# Patient Record
Sex: Male | Born: 1940
Health system: Southern US, Community
[De-identification: ages and names within clinical notes are randomized; demographics above are authoritative.]

## PROBLEM LIST (undated history)

## (undated) DIAGNOSIS — K4090 Unilateral inguinal hernia, without obstruction or gangrene, not specified as recurrent: Secondary | ICD-10-CM

## (undated) DIAGNOSIS — M5137 Other intervertebral disc degeneration, lumbosacral region: Secondary | ICD-10-CM

## (undated) DIAGNOSIS — M5030 Other cervical disc degeneration, unspecified cervical region: Secondary | ICD-10-CM

## (undated) DIAGNOSIS — M4300 Spondylolysis, site unspecified: Secondary | ICD-10-CM

## (undated) DIAGNOSIS — Z87442 Personal history of urinary calculi: Secondary | ICD-10-CM

## (undated) DIAGNOSIS — M5431 Sciatica, right side: Secondary | ICD-10-CM

## (undated) DIAGNOSIS — E559 Vitamin D deficiency, unspecified: Secondary | ICD-10-CM

## (undated) DIAGNOSIS — M1990 Unspecified osteoarthritis, unspecified site: Secondary | ICD-10-CM

## (undated) DIAGNOSIS — E785 Hyperlipidemia, unspecified: Secondary | ICD-10-CM

## (undated) HISTORY — PX: TONSILLECTOMY: SUR1361

## (undated) HISTORY — DX: Hyperlipidemia, unspecified: E78.5

## (undated) HISTORY — PX: APPENDECTOMY: SHX54

## (undated) HISTORY — PX: OTHER SURGICAL HISTORY: SHX169

---

## 2003-08-09 HISTORY — PX: REPLACEMENT TOTAL HIP W/  RESURFACING IMPLANTS: SUR1222

## 2003-08-09 HISTORY — PX: TOTAL HIP ARTHROPLASTY: SHX124

## 2005-09-20 ENCOUNTER — Ambulatory Visit (HOSPITAL_BASED_OUTPATIENT_CLINIC_OR_DEPARTMENT_OTHER): Admission: RE | Admit: 2005-09-20 | Discharge: 2005-09-20 | Payer: Self-pay | Admitting: Orthopaedic Surgery

## 2011-02-25 ENCOUNTER — Other Ambulatory Visit: Payer: Self-pay | Admitting: Orthopedic Surgery

## 2011-02-25 ENCOUNTER — Ambulatory Visit
Admission: RE | Admit: 2011-02-25 | Discharge: 2011-02-25 | Disposition: A | Discharge status: Home / Self Care | Payer: Medicare Other | Source: Ambulatory Visit | Attending: Orthopedic Surgery | Admitting: Orthopedic Surgery

## 2011-02-25 DIAGNOSIS — M169 Osteoarthritis of hip, unspecified: Secondary | ICD-10-CM

## 2011-02-25 DIAGNOSIS — M25559 Pain in unspecified hip: Secondary | ICD-10-CM

## 2011-05-08 ENCOUNTER — Emergency Department (HOSPITAL_COMMUNITY)
Admission: EM | Admit: 2011-05-08 | Discharge: 2011-05-08 | Disposition: A | Discharge status: Home / Self Care | Payer: Medicare Other | Attending: Emergency Medicine | Admitting: Emergency Medicine

## 2011-05-08 ENCOUNTER — Emergency Department (HOSPITAL_COMMUNITY): Payer: Medicare Other

## 2011-05-08 DIAGNOSIS — S20229A Contusion of unspecified back wall of thorax, initial encounter: Secondary | ICD-10-CM | POA: Insufficient documentation

## 2011-05-08 DIAGNOSIS — IMO0002 Reserved for concepts with insufficient information to code with codable children: Secondary | ICD-10-CM | POA: Insufficient documentation

## 2011-05-08 DIAGNOSIS — T1490XA Injury, unspecified, initial encounter: Secondary | ICD-10-CM | POA: Insufficient documentation

## 2011-05-08 DIAGNOSIS — M542 Cervicalgia: Secondary | ICD-10-CM | POA: Insufficient documentation

## 2011-05-08 DIAGNOSIS — S139XXA Sprain of joints and ligaments of unspecified parts of neck, initial encounter: Secondary | ICD-10-CM | POA: Insufficient documentation

## 2011-05-08 DIAGNOSIS — S5000XA Contusion of unspecified elbow, initial encounter: Secondary | ICD-10-CM | POA: Insufficient documentation

## 2011-09-07 DIAGNOSIS — Z0000 Encounter for general adult medical examination without abnormal findings: Secondary | ICD-10-CM | POA: Diagnosis not present

## 2011-10-24 DIAGNOSIS — E785 Hyperlipidemia, unspecified: Secondary | ICD-10-CM | POA: Diagnosis not present

## 2011-10-24 DIAGNOSIS — E559 Vitamin D deficiency, unspecified: Secondary | ICD-10-CM | POA: Diagnosis not present

## 2011-10-24 DIAGNOSIS — Z125 Encounter for screening for malignant neoplasm of prostate: Secondary | ICD-10-CM | POA: Diagnosis not present

## 2011-10-24 DIAGNOSIS — Z0000 Encounter for general adult medical examination without abnormal findings: Secondary | ICD-10-CM | POA: Diagnosis not present

## 2011-10-24 DIAGNOSIS — R7301 Impaired fasting glucose: Secondary | ICD-10-CM | POA: Diagnosis not present

## 2011-10-31 DIAGNOSIS — Z0000 Encounter for general adult medical examination without abnormal findings: Secondary | ICD-10-CM | POA: Diagnosis not present

## 2011-10-31 DIAGNOSIS — R7301 Impaired fasting glucose: Secondary | ICD-10-CM | POA: Diagnosis not present

## 2011-10-31 DIAGNOSIS — E785 Hyperlipidemia, unspecified: Secondary | ICD-10-CM | POA: Diagnosis not present

## 2011-10-31 DIAGNOSIS — Z125 Encounter for screening for malignant neoplasm of prostate: Secondary | ICD-10-CM | POA: Diagnosis not present

## 2011-11-01 DIAGNOSIS — Z1212 Encounter for screening for malignant neoplasm of rectum: Secondary | ICD-10-CM | POA: Diagnosis not present

## 2012-01-06 DIAGNOSIS — M25569 Pain in unspecified knee: Secondary | ICD-10-CM | POA: Diagnosis not present

## 2012-01-12 DIAGNOSIS — H25099 Other age-related incipient cataract, unspecified eye: Secondary | ICD-10-CM | POA: Diagnosis not present

## 2012-02-15 DIAGNOSIS — L821 Other seborrheic keratosis: Secondary | ICD-10-CM | POA: Diagnosis not present

## 2012-02-15 DIAGNOSIS — L919 Hypertrophic disorder of the skin, unspecified: Secondary | ICD-10-CM | POA: Diagnosis not present

## 2012-02-15 DIAGNOSIS — D239 Other benign neoplasm of skin, unspecified: Secondary | ICD-10-CM | POA: Diagnosis not present

## 2012-02-15 DIAGNOSIS — L909 Atrophic disorder of skin, unspecified: Secondary | ICD-10-CM | POA: Diagnosis not present

## 2012-08-23 ENCOUNTER — Other Ambulatory Visit: Payer: Self-pay | Admitting: Dermatology

## 2012-08-23 DIAGNOSIS — L851 Acquired keratosis [keratoderma] palmaris et plantaris: Secondary | ICD-10-CM | POA: Diagnosis not present

## 2012-08-23 DIAGNOSIS — D485 Neoplasm of uncertain behavior of skin: Secondary | ICD-10-CM | POA: Diagnosis not present

## 2012-10-23 DIAGNOSIS — S4350XA Sprain of unspecified acromioclavicular joint, initial encounter: Secondary | ICD-10-CM | POA: Diagnosis not present

## 2012-10-31 DIAGNOSIS — H0019 Chalazion unspecified eye, unspecified eyelid: Secondary | ICD-10-CM | POA: Diagnosis not present

## 2012-11-12 DIAGNOSIS — M25519 Pain in unspecified shoulder: Secondary | ICD-10-CM | POA: Diagnosis not present

## 2012-11-12 DIAGNOSIS — S4350XA Sprain of unspecified acromioclavicular joint, initial encounter: Secondary | ICD-10-CM | POA: Diagnosis not present

## 2012-11-30 DIAGNOSIS — M25519 Pain in unspecified shoulder: Secondary | ICD-10-CM | POA: Diagnosis not present

## 2012-11-30 DIAGNOSIS — S4350XA Sprain of unspecified acromioclavicular joint, initial encounter: Secondary | ICD-10-CM | POA: Diagnosis not present

## 2012-12-10 DIAGNOSIS — M25519 Pain in unspecified shoulder: Secondary | ICD-10-CM | POA: Diagnosis not present

## 2012-12-14 DIAGNOSIS — Z23 Encounter for immunization: Secondary | ICD-10-CM | POA: Diagnosis not present

## 2012-12-19 DIAGNOSIS — E785 Hyperlipidemia, unspecified: Secondary | ICD-10-CM | POA: Diagnosis not present

## 2012-12-19 DIAGNOSIS — Z125 Encounter for screening for malignant neoplasm of prostate: Secondary | ICD-10-CM | POA: Diagnosis not present

## 2012-12-19 DIAGNOSIS — R7301 Impaired fasting glucose: Secondary | ICD-10-CM | POA: Diagnosis not present

## 2012-12-19 DIAGNOSIS — E559 Vitamin D deficiency, unspecified: Secondary | ICD-10-CM | POA: Diagnosis not present

## 2012-12-25 DIAGNOSIS — Z1212 Encounter for screening for malignant neoplasm of rectum: Secondary | ICD-10-CM | POA: Diagnosis not present

## 2013-01-02 DIAGNOSIS — Z23 Encounter for immunization: Secondary | ICD-10-CM | POA: Diagnosis not present

## 2013-01-02 DIAGNOSIS — R7301 Impaired fasting glucose: Secondary | ICD-10-CM | POA: Diagnosis not present

## 2013-01-02 DIAGNOSIS — E559 Vitamin D deficiency, unspecified: Secondary | ICD-10-CM | POA: Diagnosis not present

## 2013-01-02 DIAGNOSIS — Z1331 Encounter for screening for depression: Secondary | ICD-10-CM | POA: Diagnosis not present

## 2013-01-02 DIAGNOSIS — Z6827 Body mass index (BMI) 27.0-27.9, adult: Secondary | ICD-10-CM | POA: Diagnosis not present

## 2013-01-02 DIAGNOSIS — Z125 Encounter for screening for malignant neoplasm of prostate: Secondary | ICD-10-CM | POA: Diagnosis not present

## 2013-01-02 DIAGNOSIS — M169 Osteoarthritis of hip, unspecified: Secondary | ICD-10-CM | POA: Diagnosis not present

## 2013-01-02 DIAGNOSIS — Z0000 Encounter for general adult medical examination without abnormal findings: Secondary | ICD-10-CM | POA: Diagnosis not present

## 2013-01-02 DIAGNOSIS — E785 Hyperlipidemia, unspecified: Secondary | ICD-10-CM | POA: Diagnosis not present

## 2013-01-28 DIAGNOSIS — H01009 Unspecified blepharitis unspecified eye, unspecified eyelid: Secondary | ICD-10-CM | POA: Diagnosis not present

## 2013-01-28 DIAGNOSIS — H2510 Age-related nuclear cataract, unspecified eye: Secondary | ICD-10-CM | POA: Diagnosis not present

## 2013-03-12 ENCOUNTER — Other Ambulatory Visit: Payer: Self-pay | Admitting: Orthopedic Surgery

## 2013-03-12 ENCOUNTER — Ambulatory Visit
Admission: RE | Admit: 2013-03-12 | Discharge: 2013-03-12 | Disposition: A | Discharge status: Home / Self Care | Payer: Medicare Other | Source: Ambulatory Visit | Attending: Orthopedic Surgery | Admitting: Orthopedic Surgery

## 2013-03-12 DIAGNOSIS — M25552 Pain in left hip: Secondary | ICD-10-CM

## 2013-03-12 DIAGNOSIS — M169 Osteoarthritis of hip, unspecified: Secondary | ICD-10-CM

## 2013-03-12 DIAGNOSIS — M25559 Pain in unspecified hip: Secondary | ICD-10-CM | POA: Diagnosis not present

## 2013-03-12 DIAGNOSIS — M1610 Unilateral primary osteoarthritis, unspecified hip: Secondary | ICD-10-CM

## 2013-03-12 DIAGNOSIS — M25551 Pain in right hip: Secondary | ICD-10-CM
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
## 2013-03-26 DIAGNOSIS — D239 Other benign neoplasm of skin, unspecified: Secondary | ICD-10-CM | POA: Diagnosis not present

## 2013-03-26 DIAGNOSIS — D2370 Other benign neoplasm of skin of unspecified lower limb, including hip: Secondary | ICD-10-CM | POA: Diagnosis not present

## 2013-03-26 DIAGNOSIS — L821 Other seborrheic keratosis: Secondary | ICD-10-CM | POA: Diagnosis not present

## 2013-04-04 ENCOUNTER — Encounter: Payer: Self-pay | Admitting: Internal Medicine

## 2013-05-30 ENCOUNTER — Ambulatory Visit (AMBULATORY_SURGERY_CENTER): Payer: Self-pay | Admitting: *Deleted

## 2013-05-30 VITALS — Ht 69.0 in | Wt 189.2 lb

## 2013-05-30 DIAGNOSIS — Z860100 Personal history of colon polyps, unspecified: Secondary | ICD-10-CM

## 2013-05-30 MED ORDER — MOVIPREP 100 G PO SOLR: ORAL | Status: DC

## 2013-05-30 NOTE — Progress Notes (Signed)
No allergies to eggs or soy. No problems with anesthesia.  

## 2013-06-13 ENCOUNTER — Encounter: Payer: Self-pay | Admitting: Internal Medicine

## 2013-06-13 ENCOUNTER — Ambulatory Visit (AMBULATORY_SURGERY_CENTER): Payer: Medicare Other | Admitting: Internal Medicine

## 2013-06-13 VITALS — BP 93/66 | HR 40 | Temp 96.3°F | Resp 16 | Ht 69.0 in | Wt 189.0 lb

## 2013-06-13 DIAGNOSIS — Z1211 Encounter for screening for malignant neoplasm of colon: Secondary | ICD-10-CM

## 2013-06-13 DIAGNOSIS — Z86010 Personal history of colonic polyps: Secondary | ICD-10-CM

## 2013-06-13 HISTORY — PX: COLONOSCOPY WITH PROPOFOL: SHX5780

## 2013-06-13 MED ORDER — SODIUM CHLORIDE 0.9 % IV SOLN: 500.0000 mL | INTRAVENOUS | Status: DC

## 2013-06-13 NOTE — Op Note (Signed)
[facility] [address] [place], [address]   COLONOSCOPY PROCEDURE REPORT  PATIENT: Marcus Richardson, Marcus Richardson  MR#: [id number] BIRTHDATE: 1940/10/13 , 72  yrs. old GENDER: Male ENDOSCOPIST: Roxy Cedar, MD REFERRED OZ:[id number] Recall, M.D. PROCEDURE DATE:  06/13/2013 PROCEDURE:   Colonoscopy, screening First Screening Colonoscopy - Avg.  risk and is 50 yrs.  old or older - No.  Prior Negative Screening - Now for repeat screening. 10 or more years since last screening  History of Adenoma - Now for follow-up colonoscopy & has been > or = to 3 yrs.  N/A  Polyps Removed Today? No.  Recommend repeat exam, <10 yrs? No. ASA CLASS:   Class I INDICATIONS:average risk screening.   Negative index exam 2004 MEDICATIONS: MAC sedation, administered by CRNA and propofol (Diprivan) 300mg  IV  DESCRIPTION OF PROCEDURE:   After the risks benefits and alternatives of the procedure were thoroughly explained, informed consent was obtained.  A digital rectal exam revealed no abnormalities of the rectum.   The LB [id number]  endoscope was introduced through the anus and advanced to the cecum, which was identified by both the appendix and ileocecal valve. No adverse events experienced.   The quality of the prep was good, using MoviPrep  The instrument was then slowly withdrawn as the colon was fully examined.      COLON FINDINGS: A normal appearing cecum, ileocecal valve, and appendiceal orifice were identified.  The ascending, hepatic flexure, transverse, splenic flexure, descending, sigmoid colon and rectum appeared unremarkable.  No polyps or cancers were seen. Retroflexed views revealed no abnormalities. The time to cecum=5 minutes 6 seconds.  Withdrawal time=9 minutes 48 seconds.  The scope was withdrawn and the procedure completed. COMPLICATIONS: There were no complications.  ENDOSCOPIC IMPRESSION: Normal colon  RECOMMENDATIONS: Return to the care of your primary  provider.  GI follow up as needed   eSigned:  Roxy Cedar, MD 06/13/2013 9:18 AM   cc: Rodrigo Ran, MD and The Patient
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
## 2013-06-13 NOTE — Patient Instructions (Signed)

## 2013-06-14 ENCOUNTER — Telehealth: Payer: Self-pay | Admitting: *Deleted

## 2013-06-14 NOTE — Telephone Encounter (Signed)
Left message that we called for f/u 

## 2013-08-10 ENCOUNTER — Ambulatory Visit (INDEPENDENT_AMBULATORY_CARE_PROVIDER_SITE_OTHER): Payer: Medicare Other | Admitting: Family Medicine

## 2013-08-10 ENCOUNTER — Ambulatory Visit: Payer: Medicare Other

## 2013-08-10 VITALS — BP 122/74 | HR 70 | Temp 98.3°F | Resp 16 | Ht 68.0 in | Wt 184.2 lb

## 2013-08-10 DIAGNOSIS — J189 Pneumonia, unspecified organism: Secondary | ICD-10-CM

## 2013-08-10 DIAGNOSIS — R059 Cough, unspecified: Secondary | ICD-10-CM

## 2013-08-10 DIAGNOSIS — R509 Fever, unspecified: Secondary | ICD-10-CM | POA: Diagnosis not present

## 2013-08-10 DIAGNOSIS — R05 Cough: Secondary | ICD-10-CM | POA: Diagnosis not present

## 2013-08-10 MED ORDER — CEFDINIR 300 MG PO CAPS: 300.0000 mg | ORAL_CAPSULE | Freq: Two times a day (BID) | ORAL | Status: DC

## 2013-08-10 NOTE — Patient Instructions (Signed)
Use the omnicef as directed for mild pneumonia.  Keep an eye on your temperature.  If you are not feeling better in the next few days please let me know- Sooner if worse.

## 2013-08-10 NOTE — Progress Notes (Signed)
[facility] [address] [place] [address] [phone]  Date:  08/10/2013   Name:  Marcus Richardson.   DOB:  [birth year]   MRN:  [id number]  PCP:  Jerlyn Ly, MD    Chief Complaint: URI   History of Present Illness:  Marcus Richardson. is a 73 y.o. very pleasant male patient who presents with the following:  He is here today with illness. He got sick on 12/23- he noted fever, fatigue, aches, and cough.  Assumed that he had the flu.  He was sick in bed for about 6 days; since then he has returned to work.  He then started to feel bad again and had a recurrent fever on 12/29.  He has tried to work but gets very tired later in the day and does not have his usual energy.  He does still have some cough.   He does not have chills any more, no more aches.  His temp was up to 101 last night. His fever usually comes at night.  His cough is non- productive but he does wheeze some.    No GI symtpoms.    He is generally in good health, does not take any chronic medications.    He has tried sudeafed a few times.    There are no active problems to display for this patient.   Past Medical History  Diagnosis Date  . Hyperlipidemia     no meds    Past Surgical History  Procedure Laterality Date  . Replacement total hip w/  resurfacing implants Right 2005    History  Substance Use Topics  . Smoking status: Never Smoker   . Smokeless tobacco: Never Used  . Alcohol Use: 4.2 oz/week    7 Glasses of wine per week    Family History  Problem Relation Age of Onset  . Colon cancer Neg Hx   . Esophageal cancer Neg Hx   . Rectal cancer Neg Hx   . Stomach cancer Neg Hx   . Hyperlipidemia Mother   . Depression Sister   . Hypertension Brother   . Hyperlipidemia Brother     No Known Allergies  Medication list has been reviewed and updated.  No current outpatient prescriptions on file prior to visit.   No current facility-administered medications on file  prior to visit.    Review of Systems:  As per HPI- otherwise negative.   Physical Examination: Filed Vitals:   08/10/13 1331  BP: 122/74  Pulse: 70  Temp: 98.3 F (36.8 C)  Resp: 16   Filed Vitals:   08/10/13 1331  Height: 5\' 8"  (1.727 m)  Weight: 184 lb 3.2 oz (83.553 kg)   Body mass index is 28.01 kg/(m^2). Ideal Body Weight: Weight in (lb) to have BMI = 25: 164.1  GEN: WDWN, NAD, Non-toxic, A & O x 3, looks well HEENT: Atraumatic, Normocephalic. Neck supple. No masses, No LAD.  Bilateral TM wnl, oropharynx normal.  PEERL,EOMI.   Ears and Nose: No external deformity. CV: RRR, No M/G/R. No JVD. No thrill. No extra heart sounds. PULM: CTA B, no wheezes, crackles, rhonchi. No retractions. No resp. distress. No accessory muscle use. EXTR: No c/c/e NEURO Normal gait.  PSYCH: Normally interactive. Conversant. Not depressed or anxious appearing.  Calm demeanor.   [facility] reading (PRIMARY) by  Dr. Lorelei Pont. CXR:  Mild left sided pneumonia.    CHEST 2 VIEW  COMPARISON: None.  FINDINGS: The heart size and  mediastinal contours are normal. The lungs demonstrate mild central airway thickening without focal airspace disease, edema or pleural effusion. There is mild biapical scarring. There is a probable old fracture of the right 2nd rib anteriorly with associated callus.  IMPRESSION: No active cardiopulmonary process identified   Assessment and Plan: Cough - Plan: DG Chest 2 View  Fever, unspecified - Plan: DG Chest 2 View  CAP (community acquired pneumonia) - Plan: cefdinir (OMNICEF) 300 MG capsule  Suspect mild pneumonia.  He has been ill for 2 weeks now.  Treat with omnicef.  He does not desire anything else for cough.  Asked him to please let me know if not better in the next few days- Sooner if worse.     Signed Lamar Blinks, MD

## 2014-01-27 DIAGNOSIS — R7301 Impaired fasting glucose: Secondary | ICD-10-CM | POA: Diagnosis not present

## 2014-01-27 DIAGNOSIS — E785 Hyperlipidemia, unspecified: Secondary | ICD-10-CM | POA: Diagnosis not present

## 2014-01-27 DIAGNOSIS — Z125 Encounter for screening for malignant neoplasm of prostate: Secondary | ICD-10-CM | POA: Diagnosis not present

## 2014-01-27 DIAGNOSIS — E559 Vitamin D deficiency, unspecified: Secondary | ICD-10-CM | POA: Diagnosis not present

## 2014-01-28 DIAGNOSIS — Z1331 Encounter for screening for depression: Secondary | ICD-10-CM | POA: Diagnosis not present

## 2014-01-28 DIAGNOSIS — M169 Osteoarthritis of hip, unspecified: Secondary | ICD-10-CM | POA: Diagnosis not present

## 2014-01-28 DIAGNOSIS — E559 Vitamin D deficiency, unspecified: Secondary | ICD-10-CM | POA: Diagnosis not present

## 2014-01-28 DIAGNOSIS — Z0000 Encounter for general adult medical examination without abnormal findings: Secondary | ICD-10-CM | POA: Diagnosis not present

## 2014-01-28 DIAGNOSIS — M1610 Unilateral primary osteoarthritis, unspecified hip: Secondary | ICD-10-CM | POA: Diagnosis not present

## 2014-01-28 DIAGNOSIS — Z6827 Body mass index (BMI) 27.0-27.9, adult: Secondary | ICD-10-CM | POA: Diagnosis not present

## 2014-01-28 DIAGNOSIS — R7301 Impaired fasting glucose: Secondary | ICD-10-CM | POA: Diagnosis not present

## 2014-01-28 DIAGNOSIS — E785 Hyperlipidemia, unspecified: Secondary | ICD-10-CM | POA: Diagnosis not present

## 2014-01-28 DIAGNOSIS — Z125 Encounter for screening for malignant neoplasm of prostate: Secondary | ICD-10-CM | POA: Diagnosis not present

## 2014-02-11 DIAGNOSIS — Z1212 Encounter for screening for malignant neoplasm of rectum: Secondary | ICD-10-CM | POA: Diagnosis not present

## 2014-03-05 DIAGNOSIS — M79609 Pain in unspecified limb: Secondary | ICD-10-CM | POA: Diagnosis not present

## 2014-05-20 DIAGNOSIS — Z23 Encounter for immunization: Secondary | ICD-10-CM | POA: Diagnosis not present

## 2014-05-28 DIAGNOSIS — Z808 Family history of malignant neoplasm of other organs or systems: Secondary | ICD-10-CM | POA: Diagnosis not present

## 2014-05-28 DIAGNOSIS — Z86018 Personal history of other benign neoplasm: Secondary | ICD-10-CM | POA: Diagnosis not present

## 2014-05-28 DIAGNOSIS — D239 Other benign neoplasm of skin, unspecified: Secondary | ICD-10-CM | POA: Diagnosis not present

## 2014-05-28 DIAGNOSIS — L821 Other seborrheic keratosis: Secondary | ICD-10-CM | POA: Diagnosis not present

## 2014-06-06 DIAGNOSIS — H2513 Age-related nuclear cataract, bilateral: Secondary | ICD-10-CM | POA: Diagnosis not present

## 2014-06-06 DIAGNOSIS — H524 Presbyopia: Secondary | ICD-10-CM | POA: Diagnosis not present

## 2014-06-06 DIAGNOSIS — H5213 Myopia, bilateral: Secondary | ICD-10-CM | POA: Diagnosis not present

## 2014-06-06 DIAGNOSIS — H52223 Regular astigmatism, bilateral: Secondary | ICD-10-CM | POA: Diagnosis not present

## 2014-07-31 ENCOUNTER — Ambulatory Visit (INDEPENDENT_AMBULATORY_CARE_PROVIDER_SITE_OTHER): Payer: Medicare Other | Admitting: Emergency Medicine

## 2014-07-31 VITALS — BP 120/76 | HR 61 | Temp 98.2°F | Resp 18 | Ht 68.0 in | Wt 186.0 lb

## 2014-07-31 DIAGNOSIS — H9202 Otalgia, left ear: Secondary | ICD-10-CM
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
## 2014-07-31 NOTE — Patient Instructions (Signed)
Temporomandibular Problems  Temporomandibular joint (TMJ) dysfunction means there are problems with the joint between your jaw and your skull. This is a joint lined by cartilage like other joints in your body but also has a small disc in the joint which keeps the bones from rubbing on each other. These joints are like other joints and can get inflamed (sore) from arthritis and other problems. When this joint gets sore, it can cause headaches and pain in the jaw and the face. CAUSES  Usually the arthritic types of problems are caused by soreness in the joint. Soreness in the joint can also be caused by overuse. This may come from grinding your teeth. It may also come from mis-alignment in the joint. DIAGNOSIS Diagnosis of this condition can often be made by history and exam. Sometimes your caregiver may need X-rays or an MRI scan to determine the exact cause. It may be necessary to see your dentist to determine if your teeth and jaws are lined up correctly. TREATMENT  Most of the time this problem is not serious; however, sometimes it can persist (become chronic). When this happens medications that will cut down on inflammation (soreness) help. Sometimes a shot of cortisone into the joint will be helpful. If your teeth are not aligned it may help for your dentist to make a splint for your mouth that can help this problem. If no physical problems can be found, the problem may come from tension. If tension is found to be the cause, biofeedback or relaxation techniques may be helpful. HOME CARE INSTRUCTIONS   Later in the day, applications of ice packs may be helpful. Ice can be used in a plastic bag with a towel around it to prevent frostbite to skin. This may be used about every 2 hours for 20 to 30 minutes, as needed while awake, or as directed by your caregiver.  Only take over-the-counter or prescription medicines for pain, discomfort, or fever as directed by your caregiver.  If physical therapy was  prescribed, follow your caregiver's directions.  Wear mouth appliances as directed if they were given. Document Released: 04/19/2001 Document Revised: 10/17/2011 Document Reviewed: 07/27/2008 ExitCare Patient Information 2015 [employer]. This information is not intended to replace advice given to you by your health care provider. Make sure you discuss any questions you have with your health care provider.  

## 2015-01-29 DIAGNOSIS — M79675 Pain in left toe(s): Secondary | ICD-10-CM | POA: Diagnosis not present

## 2015-02-06 DIAGNOSIS — R7301 Impaired fasting glucose: Secondary | ICD-10-CM | POA: Diagnosis not present

## 2015-02-06 DIAGNOSIS — Z125 Encounter for screening for malignant neoplasm of prostate: Secondary | ICD-10-CM | POA: Diagnosis not present

## 2015-02-06 DIAGNOSIS — E785 Hyperlipidemia, unspecified: Secondary | ICD-10-CM | POA: Diagnosis not present

## 2015-02-06 DIAGNOSIS — E559 Vitamin D deficiency, unspecified: Secondary | ICD-10-CM | POA: Diagnosis not present

## 2015-02-13 DIAGNOSIS — M109 Gout, unspecified: Secondary | ICD-10-CM | POA: Diagnosis not present

## 2015-02-13 DIAGNOSIS — E785 Hyperlipidemia, unspecified: Secondary | ICD-10-CM | POA: Diagnosis not present

## 2015-02-13 DIAGNOSIS — Z1389 Encounter for screening for other disorder: Secondary | ICD-10-CM | POA: Diagnosis not present

## 2015-02-13 DIAGNOSIS — E559 Vitamin D deficiency, unspecified: Secondary | ICD-10-CM | POA: Diagnosis not present

## 2015-02-13 DIAGNOSIS — Z0000 Encounter for general adult medical examination without abnormal findings: Secondary | ICD-10-CM | POA: Diagnosis not present

## 2015-02-13 DIAGNOSIS — K219 Gastro-esophageal reflux disease without esophagitis: Secondary | ICD-10-CM | POA: Diagnosis not present

## 2015-02-13 DIAGNOSIS — R7301 Impaired fasting glucose: Secondary | ICD-10-CM | POA: Diagnosis not present

## 2015-02-13 DIAGNOSIS — M169 Osteoarthritis of hip, unspecified: Secondary | ICD-10-CM | POA: Diagnosis not present

## 2015-02-13 DIAGNOSIS — Z6827 Body mass index (BMI) 27.0-27.9, adult: Secondary | ICD-10-CM | POA: Diagnosis not present

## 2015-02-17 DIAGNOSIS — Z1212 Encounter for screening for malignant neoplasm of rectum: Secondary | ICD-10-CM | POA: Diagnosis not present

## 2015-02-20 DIAGNOSIS — M79675 Pain in left toe(s): Secondary | ICD-10-CM | POA: Diagnosis not present

## 2015-06-11 DIAGNOSIS — L821 Other seborrheic keratosis: Secondary | ICD-10-CM | POA: Diagnosis not present

## 2015-06-11 DIAGNOSIS — L731 Pseudofolliculitis barbae: Secondary | ICD-10-CM | POA: Diagnosis not present

## 2015-06-11 DIAGNOSIS — D2271 Melanocytic nevi of right lower limb, including hip: Secondary | ICD-10-CM | POA: Diagnosis not present

## 2015-06-11 DIAGNOSIS — Z86018 Personal history of other benign neoplasm: Secondary | ICD-10-CM | POA: Diagnosis not present

## 2015-06-11 DIAGNOSIS — D225 Melanocytic nevi of trunk: Secondary | ICD-10-CM | POA: Diagnosis not present

## 2015-06-11 DIAGNOSIS — D2221 Melanocytic nevi of right ear and external auricular canal: Secondary | ICD-10-CM | POA: Diagnosis not present

## 2015-06-21 IMAGING — CR DG CHEST 2V
2 series · 2 of 2 positions shown · non-contrast
Comparison: None.

CLINICAL DATA: Shortness of breath.

EXAM:
CHEST  2 VIEW

[PA]
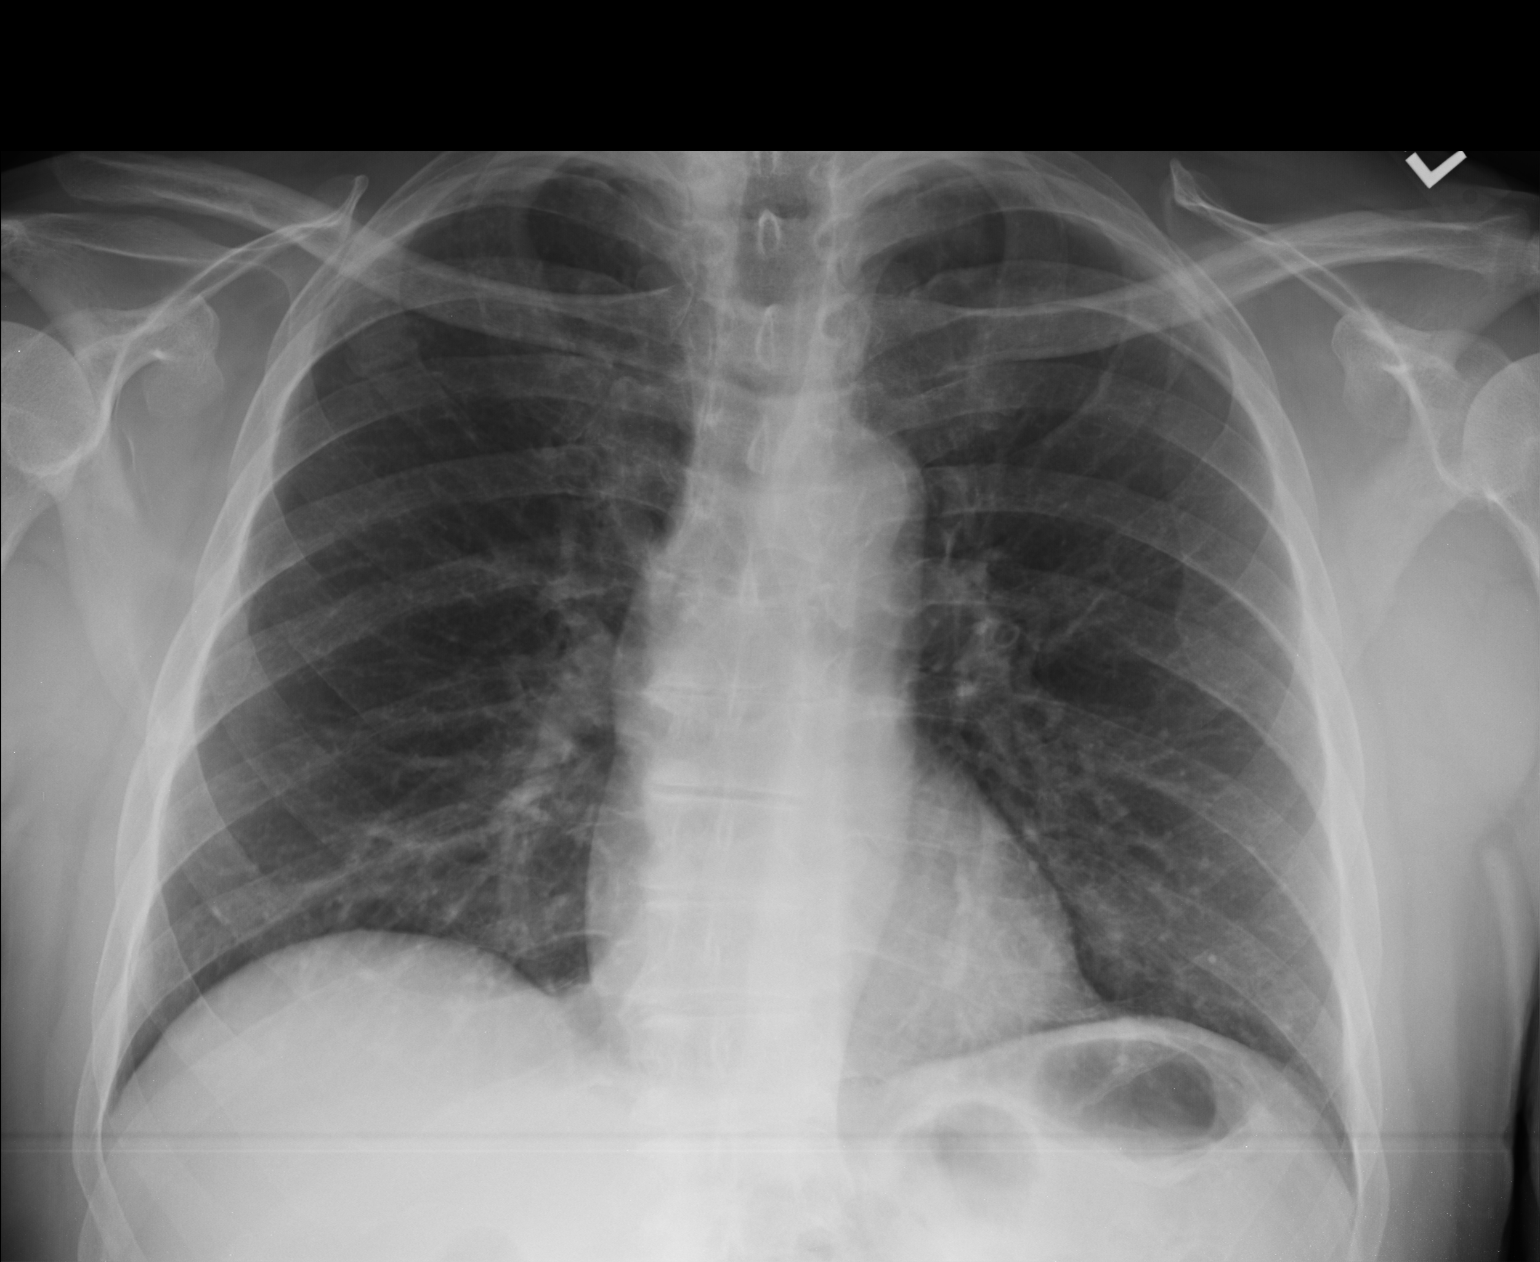

[lateral]
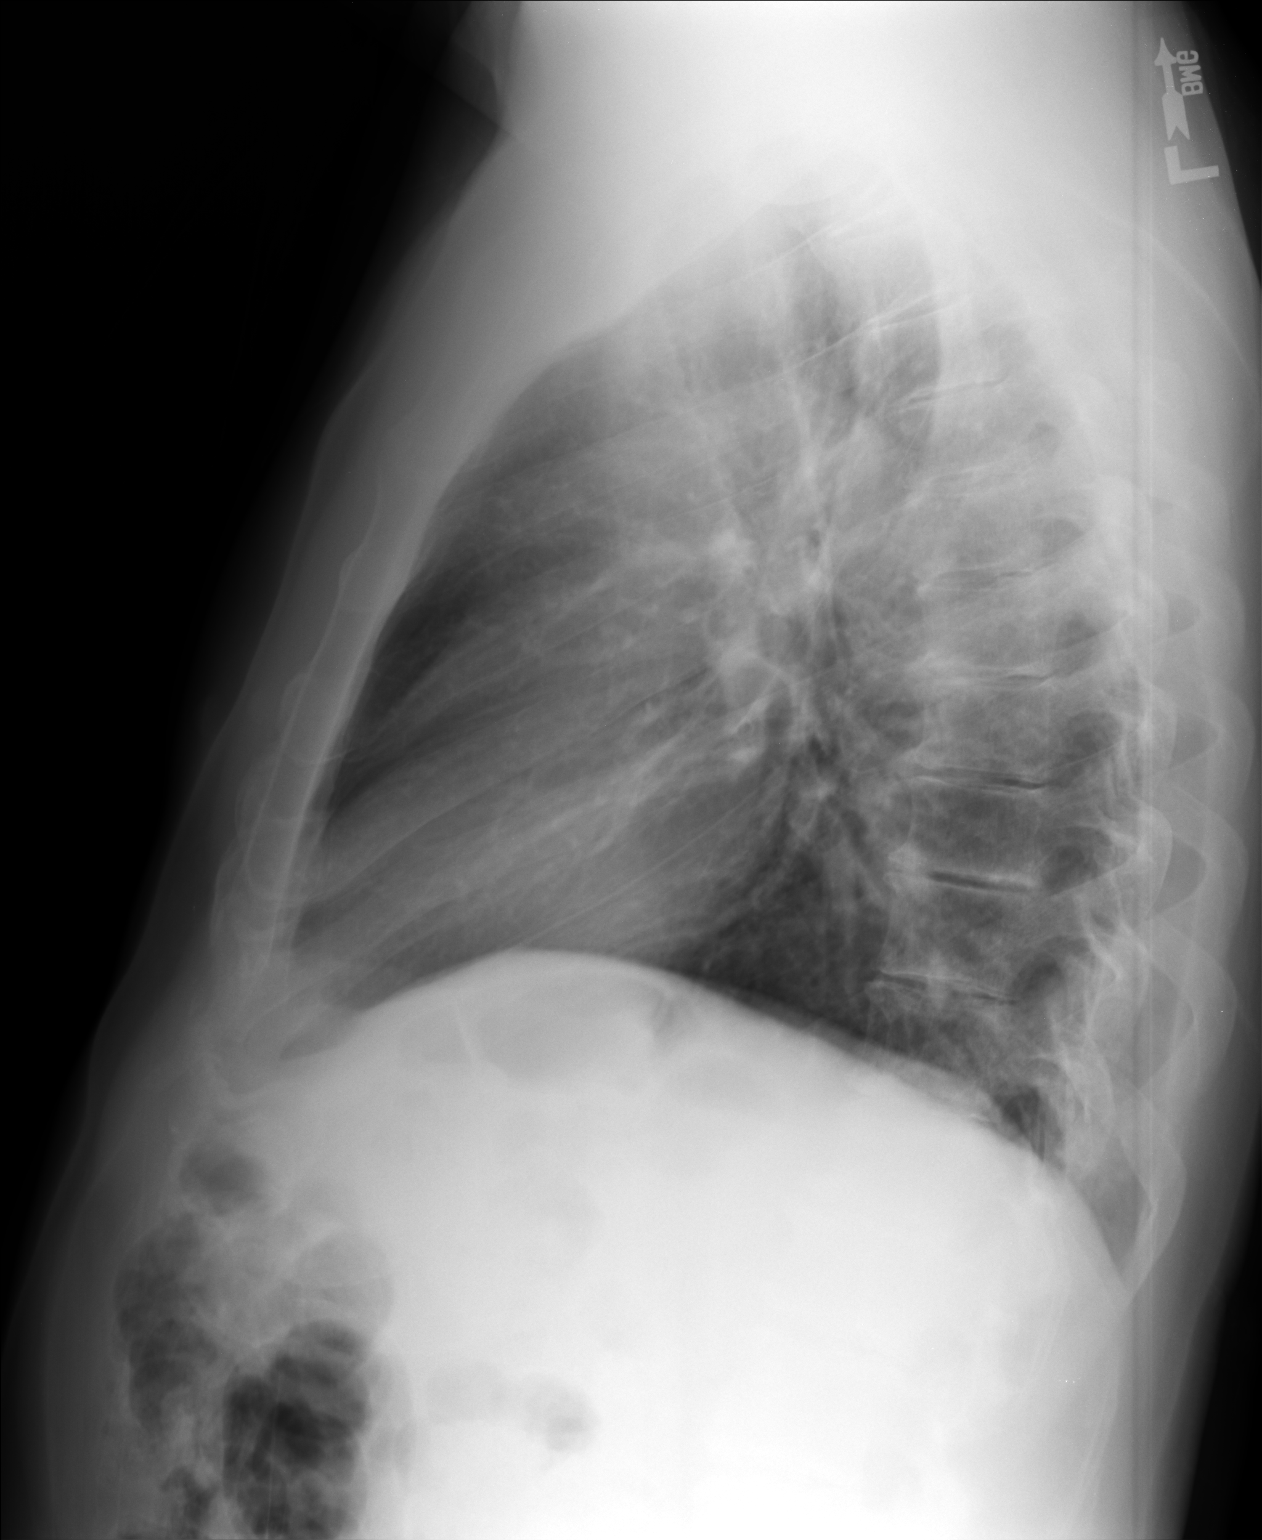

[2 of 2 positions shown; findings below may reference images not displayed]

FINDINGS: The heart size and mediastinal contours are normal. The lungs
demonstrate mild central airway thickening without focal airspace
disease, edema or pleural effusion. There is mild biapical scarring.
There is a probable old fracture of the right 2nd rib anteriorly
with associated callus.
IMPRESSION: No active cardiopulmonary process identified.

## 2015-11-30 DIAGNOSIS — M109 Gout, unspecified: Secondary | ICD-10-CM | POA: Diagnosis not present

## 2015-11-30 DIAGNOSIS — Z6828 Body mass index (BMI) 28.0-28.9, adult: Secondary | ICD-10-CM | POA: Diagnosis not present

## 2016-01-28 DIAGNOSIS — Z6827 Body mass index (BMI) 27.0-27.9, adult: Secondary | ICD-10-CM | POA: Diagnosis not present

## 2016-01-28 DIAGNOSIS — M79671 Pain in right foot: Secondary | ICD-10-CM | POA: Diagnosis not present

## 2016-01-28 DIAGNOSIS — R600 Localized edema: Secondary | ICD-10-CM | POA: Diagnosis not present

## 2016-02-03 DIAGNOSIS — K644 Residual hemorrhoidal skin tags: Secondary | ICD-10-CM | POA: Diagnosis not present

## 2016-02-03 DIAGNOSIS — Z6827 Body mass index (BMI) 27.0-27.9, adult: Secondary | ICD-10-CM | POA: Diagnosis not present

## 2016-03-02 DIAGNOSIS — R7301 Impaired fasting glucose: Secondary | ICD-10-CM | POA: Diagnosis not present

## 2016-03-02 DIAGNOSIS — E784 Other hyperlipidemia: Secondary | ICD-10-CM | POA: Diagnosis not present

## 2016-03-02 DIAGNOSIS — E559 Vitamin D deficiency, unspecified: Secondary | ICD-10-CM | POA: Diagnosis not present

## 2016-03-02 DIAGNOSIS — M109 Gout, unspecified: Secondary | ICD-10-CM | POA: Diagnosis not present

## 2016-03-02 DIAGNOSIS — Z125 Encounter for screening for malignant neoplasm of prostate: Secondary | ICD-10-CM | POA: Diagnosis not present

## 2016-03-08 DIAGNOSIS — Z0000 Encounter for general adult medical examination without abnormal findings: Secondary | ICD-10-CM | POA: Diagnosis not present

## 2016-03-08 DIAGNOSIS — K219 Gastro-esophageal reflux disease without esophagitis: Secondary | ICD-10-CM | POA: Diagnosis not present

## 2016-03-08 DIAGNOSIS — M169 Osteoarthritis of hip, unspecified: Secondary | ICD-10-CM | POA: Diagnosis not present

## 2016-03-08 DIAGNOSIS — E784 Other hyperlipidemia: Secondary | ICD-10-CM | POA: Diagnosis not present

## 2016-03-08 DIAGNOSIS — K644 Residual hemorrhoidal skin tags: Secondary | ICD-10-CM | POA: Diagnosis not present

## 2016-03-08 DIAGNOSIS — Z1389 Encounter for screening for other disorder: Secondary | ICD-10-CM | POA: Diagnosis not present

## 2016-03-08 DIAGNOSIS — R7301 Impaired fasting glucose: Secondary | ICD-10-CM | POA: Diagnosis not present

## 2016-03-08 DIAGNOSIS — Z125 Encounter for screening for malignant neoplasm of prostate: Secondary | ICD-10-CM | POA: Diagnosis not present

## 2016-03-08 DIAGNOSIS — M109 Gout, unspecified: Secondary | ICD-10-CM | POA: Diagnosis not present

## 2016-03-08 DIAGNOSIS — E559 Vitamin D deficiency, unspecified: Secondary | ICD-10-CM | POA: Diagnosis not present

## 2016-03-08 DIAGNOSIS — Z6828 Body mass index (BMI) 28.0-28.9, adult: Secondary | ICD-10-CM | POA: Diagnosis not present

## 2016-03-08 DIAGNOSIS — M79671 Pain in right foot: Secondary | ICD-10-CM | POA: Diagnosis not present

## 2016-03-14 DIAGNOSIS — Z1212 Encounter for screening for malignant neoplasm of rectum: Secondary | ICD-10-CM | POA: Diagnosis not present

## 2016-03-31 ENCOUNTER — Ambulatory Visit (INDEPENDENT_AMBULATORY_CARE_PROVIDER_SITE_OTHER): Payer: Medicare Other | Admitting: Emergency Medicine

## 2016-03-31 VITALS — BP 122/70 | HR 51 | Temp 98.0°F | Resp 16 | Ht 68.0 in | Wt 186.0 lb

## 2016-03-31 DIAGNOSIS — W57XXXA Bitten or stung by nonvenomous insect and other nonvenomous arthropods, initial encounter: Secondary | ICD-10-CM | POA: Diagnosis not present

## 2016-03-31 DIAGNOSIS — S40862A Insect bite (nonvenomous) of left upper arm, initial encounter: Secondary | ICD-10-CM

## 2016-03-31 MED ORDER — DOXYCYCLINE HYCLATE 100 MG PO TABS: 100.0000 mg | ORAL_TABLET | Freq: Two times a day (BID) | ORAL | 0 refills | Status: DC

## 2016-03-31 MED ORDER — DOXYCYCLINE HYCLATE 100 MG PO TABS: ORAL_TABLET | ORAL | 0 refills | Status: DC

## 2016-03-31 NOTE — Progress Notes (Signed)
Subjective:  This chart was scribed for  Arlyss Queen MD,  by Tamsen Roers, at [facility].  This patient was seen in room 14 and the patient's care was started at 12:30 PM.   Chief Complaint  Patient presents with  . Tick Removal    tick bite underleft underpit     Patient ID: Marcus Jeffords., male    DOB: 1941-03-09, 74 y.o.   MRN: [id number]  HPI HPI Comments: Marcus Baskerville. is a 75 y.o. male who presents to the [facility] complaining of a tick bite on his left underarm area which occurred one week ago while back packing at the [facility]. (15-19th).  Two days ago, he noticed a "black appendage" and tweezed it off.  He did not notice a bulls eye rash but does note of some redness to the area. Patient enjoys hiking frequently and will go on a trip in [place] for three weeks. He is not on any medications at the time.   There are no active problems to display for this patient.  Past Medical History:  Diagnosis Date  . Hyperlipidemia    no meds   Past Surgical History:  Procedure Laterality Date  . REPLACEMENT TOTAL HIP W/  RESURFACING IMPLANTS Right 2005   No Known Allergies Prior to Admission medications   Not on File   Social History   Social History  . Marital status: Married    Spouse name: N/A  . Number of children: N/A  . Years of education: N/A   Occupational History  . Not on file.   Social History Main Topics  . Smoking status: Never Smoker  . Smokeless tobacco: Never Used  . Alcohol use 4.2 oz/week    7 Glasses of wine per week  . Drug use: No  . Sexual activity: Not on file   Other Topics Concern  . Not on file   Social History Narrative  . No narrative on file      Review of Systems  Constitutional: Negative for chills and fever.  Eyes: Negative for pain, redness and itching.  Respiratory: Negative for cough and shortness of breath.   Gastrointestinal: Negative for nausea and vomiting.    Musculoskeletal: Negative for neck pain and neck stiffness.  Skin: Positive for rash.       Objective:   Physical Exam Vitals:   03/31/16 1221  BP: 122/70  Pulse: (!) 51  Resp: 16  Temp: 98 F (36.7 C)  TempSrc: Oral  SpO2: 97%  Weight: 186 lb (84.4 kg)  Height: 5\' 8"  (1.727 m)    CONSTITUTIONAL: Well developed/well nourished HEAD: Normocephalic/atraumatic EYES: EOMI/PERRL ENMT: Mucous membranes moist CV: S1/S2 noted, no murmurs/rubs/gallops noted LUNGS: Lungs are clear to auscultation bilaterally, no apparent distress NEURO: Pt is awake/alert/appropriate, moves all extremitiesx4.  No facial droop.   EXTREMITIES: pulses normal/equal, full ROM SKIN: 1 by 1 cm bite like area inside of his left arm.  PSYCH: no abnormalities of mood noted, alert and oriented to situation  Meds ordered this encounter  Medications  . doxycycline (VIBRA-TABS) 100 MG tablet    Sig: Take 1 tablet (100 mg total) by mouth 2 (two) times daily.    Dispense:  28 tablet    Refill:  0  . doxycycline (VIBRA-TABS) 100 MG tablet    Sig: Take 2 pills one dose with food.    Dispense:  2 tablet    Refill:  0  Assessment & Plan:  We discussed tick diseases. I gave him doxycycline 20 mg 1 dose as Lyme prophylaxis. He was given a prescription to carry  with him to [place] for an upcoming trip. He will be hiking for 3 weeks and not available to get checked.I personally performed the services described in this documentation, which was scribed in my presence. The recorded information has been reviewed and is accurate. Latricia Heft.D.

## 2016-03-31 NOTE — Patient Instructions (Addendum)
IF you received an x-ray today, you will receive an invoice from [facility]. Please contact [facility] at [phone] with questions or concerns regarding your invoice.   IF you received labwork today, you will receive an invoice from [employer]. Please contact [facility] at [phone] with questions or concerns regarding your invoice.   Our billing staff will not be able to assist you with questions regarding bills from these companies.  You will be contacted with the lab results as soon as they are available. The fastest way to get your results is to activate your My Chart account. Instructions are located on the last page of this paperwork. If you have not heard from [place] regarding the results in 2 weeks, please contact this office.      Tick Bite Information Ticks are insects that attach themselves to the skin and draw blood for food. There are various types of ticks. Common types include wood ticks and deer ticks. Most ticks live in shrubs and grassy areas. Ticks can climb onto your body when you make contact with leaves or grass where the tick is waiting. The most common places on the body for ticks to attach themselves are the scalp, neck, armpits, waist, and groin. Most tick bites are harmless, but sometimes ticks carry germs that cause diseases. These germs can be spread to a person during the tick's feeding process. The chance of a disease spreading through a tick bite depends on:   The type of tick.  Time of year.   How long the tick is attached.   Geographic location.  HOW CAN YOU PREVENT TICK BITES? Take these steps to help prevent tick bites when you are outdoors:  Wear protective clothing. Long sleeves and long pants are best.   Wear white clothes so you can see ticks more easily.  Tuck your pant legs into your socks.   If walking on a trail, stay in the middle of the trail to avoid brushing against  bushes.  Avoid walking through areas with long grass.  Put insect repellent on all exposed skin and along boot tops, pant legs, and sleeve cuffs.   Check clothing, hair, and skin repeatedly and before going inside.   Brush off any ticks that are not attached.  Take a shower or bath as soon as possible after being outdoors.  WHAT IS THE PROPER WAY TO REMOVE A TICK? Ticks should be removed as soon as possible to help prevent diseases caused by tick bites. 1. If latex gloves are available, put them on before trying to remove a tick.  2. Using fine-point tweezers, grasp the tick as close to the skin as possible. You may also use curved forceps or a tick removal tool. Grasp the tick as close to its head as possible. Avoid grasping the tick on its body. 3. Pull gently with steady upward pressure until the tick lets go. Do not twist the tick or jerk it suddenly. This may break off the tick's head or mouth parts. 4. Do not squeeze or crush the tick's body. This could force disease-carrying fluids from the tick into your body.  5. After the tick is removed, wash the bite area and your hands with soap and water or other disinfectant such as alcohol. 6. Apply a small amount of antiseptic cream or ointment to the bite site.  7. Wash and disinfect any instruments that were used.  Do not try to remove a tick by applying  a hot match, petroleum jelly, or fingernail polish to the tick. These methods do not work and may increase the chances of disease being spread from the tick bite.  WHEN SHOULD YOU SEEK MEDICAL CARE? Contact your health care provider if you are unable to remove a tick from your skin or if a part of the tick breaks off and is stuck in the skin.  After a tick bite, you need to be aware of signs and symptoms that could be related to diseases spread by ticks. Contact your health care provider if you develop any of the following in the days or weeks after the tick bite:  Unexplained  fever.  Rash. A circular rash that appears days or weeks after the tick bite may indicate the possibility of Lyme disease. The rash may resemble a target with a bull's-eye and may occur at a different part of your body than the tick bite.  Redness and swelling in the area of the tick bite.   Tender, swollen lymph glands.   Diarrhea.   Weight loss.   Cough.   Fatigue.   Muscle, joint, or bone pain.   Abdominal pain.   Headache.   Lethargy or a change in your level of consciousness.  Difficulty walking or moving your legs.   Numbness in the legs.   Paralysis.  Shortness of breath.   Confusion.   Repeated vomiting.    This information is not intended to replace advice given to you by your health care provider. Make sure you discuss any questions you have with your health care provider.   Document Released: 07/22/2000 Document Revised: 08/15/2014 Document Reviewed: 01/02/2013 Elsevier Interactive Patient Education [employer].

## 2016-06-15 DIAGNOSIS — D2271 Melanocytic nevi of right lower limb, including hip: Secondary | ICD-10-CM | POA: Diagnosis not present

## 2016-06-15 DIAGNOSIS — Z808 Family history of malignant neoplasm of other organs or systems: Secondary | ICD-10-CM | POA: Diagnosis not present

## 2016-06-15 DIAGNOSIS — D225 Melanocytic nevi of trunk: Secondary | ICD-10-CM | POA: Diagnosis not present

## 2016-06-15 DIAGNOSIS — Z23 Encounter for immunization: Secondary | ICD-10-CM | POA: Diagnosis not present

## 2016-06-15 DIAGNOSIS — L821 Other seborrheic keratosis: Secondary | ICD-10-CM | POA: Diagnosis not present

## 2016-06-15 DIAGNOSIS — L281 Prurigo nodularis: Secondary | ICD-10-CM | POA: Diagnosis not present

## 2016-06-15 DIAGNOSIS — Z86018 Personal history of other benign neoplasm: Secondary | ICD-10-CM | POA: Diagnosis not present

## 2016-10-26 DIAGNOSIS — M545 Low back pain: Secondary | ICD-10-CM | POA: Diagnosis not present

## 2016-10-27 DIAGNOSIS — M25551 Pain in right hip: Secondary | ICD-10-CM | POA: Diagnosis not present

## 2016-10-27 DIAGNOSIS — M545 Low back pain: Secondary | ICD-10-CM | POA: Diagnosis not present

## 2016-10-27 DIAGNOSIS — M25552 Pain in left hip: Secondary | ICD-10-CM | POA: Diagnosis not present

## 2016-11-02 ENCOUNTER — Ambulatory Visit
Admission: RE | Admit: 2016-11-02 | Discharge: 2016-11-02 | Disposition: A | Discharge status: Home / Self Care | Payer: Medicare Other | Source: Ambulatory Visit | Attending: Orthopedic Surgery | Admitting: Orthopedic Surgery

## 2016-11-02 ENCOUNTER — Other Ambulatory Visit: Payer: Self-pay | Admitting: Orthopedic Surgery

## 2016-11-02 DIAGNOSIS — M545 Low back pain: Secondary | ICD-10-CM | POA: Diagnosis not present

## 2016-11-02 DIAGNOSIS — Z96641 Presence of right artificial hip joint: Secondary | ICD-10-CM | POA: Diagnosis not present

## 2016-11-02 DIAGNOSIS — M25559 Pain in unspecified hip: Secondary | ICD-10-CM | POA: Diagnosis not present

## 2016-11-02 DIAGNOSIS — M1611 Unilateral primary osteoarthritis, right hip: Secondary | ICD-10-CM

## 2016-11-02 DIAGNOSIS — Z471 Aftercare following joint replacement surgery: Secondary | ICD-10-CM | POA: Diagnosis not present

## 2016-11-03 DIAGNOSIS — M25551 Pain in right hip: Secondary | ICD-10-CM | POA: Diagnosis not present

## 2016-11-03 DIAGNOSIS — M545 Low back pain: Secondary | ICD-10-CM | POA: Diagnosis not present

## 2016-11-18 DIAGNOSIS — M48061 Spinal stenosis, lumbar region without neurogenic claudication: Secondary | ICD-10-CM | POA: Diagnosis not present

## 2016-11-18 DIAGNOSIS — M19011 Primary osteoarthritis, right shoulder: Secondary | ICD-10-CM | POA: Diagnosis not present

## 2016-11-29 DIAGNOSIS — M4316 Spondylolisthesis, lumbar region: Secondary | ICD-10-CM | POA: Diagnosis not present

## 2016-11-29 DIAGNOSIS — Z96641 Presence of right artificial hip joint: Secondary | ICD-10-CM | POA: Diagnosis not present

## 2016-11-29 DIAGNOSIS — M5431 Sciatica, right side: Secondary | ICD-10-CM | POA: Diagnosis not present

## 2016-11-29 DIAGNOSIS — M549 Dorsalgia, unspecified: Secondary | ICD-10-CM | POA: Diagnosis not present

## 2016-12-13 DIAGNOSIS — M4316 Spondylolisthesis, lumbar region: Secondary | ICD-10-CM | POA: Diagnosis not present

## 2016-12-13 DIAGNOSIS — M5431 Sciatica, right side: Secondary | ICD-10-CM | POA: Diagnosis not present

## 2016-12-21 DIAGNOSIS — M19011 Primary osteoarthritis, right shoulder: Secondary | ICD-10-CM | POA: Diagnosis not present

## 2016-12-28 DIAGNOSIS — M48062 Spinal stenosis, lumbar region with neurogenic claudication: Secondary | ICD-10-CM | POA: Diagnosis present

## 2016-12-28 DIAGNOSIS — M5136 Other intervertebral disc degeneration, lumbar region: Secondary | ICD-10-CM | POA: Diagnosis not present

## 2016-12-28 DIAGNOSIS — M4310 Spondylolisthesis, site unspecified: Secondary | ICD-10-CM | POA: Diagnosis not present

## 2016-12-28 DIAGNOSIS — M4316 Spondylolisthesis, lumbar region: Secondary | ICD-10-CM | POA: Diagnosis not present

## 2016-12-28 DIAGNOSIS — M5416 Radiculopathy, lumbar region: Secondary | ICD-10-CM | POA: Diagnosis present

## 2016-12-28 DIAGNOSIS — M48061 Spinal stenosis, lumbar region without neurogenic claudication: Secondary | ICD-10-CM | POA: Diagnosis not present

## 2016-12-30 HISTORY — PX: BACK SURGERY: SHX140

## 2016-12-30 HISTORY — PX: SPINAL FUSION: SHX223

## 2016-12-30 HISTORY — PX: ANTERIOR / POSTERIOR COMBINED FUSION LUMBAR SPINE: SUR286

## 2017-01-10 DIAGNOSIS — M4316 Spondylolisthesis, lumbar region: Secondary | ICD-10-CM | POA: Diagnosis not present

## 2017-01-10 DIAGNOSIS — Z5189 Encounter for other specified aftercare: Secondary | ICD-10-CM | POA: Diagnosis not present

## 2017-02-13 DIAGNOSIS — M4316 Spondylolisthesis, lumbar region: Secondary | ICD-10-CM | POA: Diagnosis not present

## 2017-02-13 DIAGNOSIS — Z4789 Encounter for other orthopedic aftercare: Secondary | ICD-10-CM | POA: Diagnosis not present

## 2017-02-13 DIAGNOSIS — M48061 Spinal stenosis, lumbar region without neurogenic claudication: Secondary | ICD-10-CM | POA: Diagnosis not present

## 2017-02-14 DIAGNOSIS — M47815 Spondylosis without myelopathy or radiculopathy, thoracolumbar region: Secondary | ICD-10-CM | POA: Diagnosis not present

## 2017-02-14 DIAGNOSIS — M4326 Fusion of spine, lumbar region: Secondary | ICD-10-CM | POA: Diagnosis not present

## 2017-02-14 DIAGNOSIS — M4316 Spondylolisthesis, lumbar region: Secondary | ICD-10-CM | POA: Diagnosis not present

## 2017-02-15 DIAGNOSIS — M48061 Spinal stenosis, lumbar region without neurogenic claudication: Secondary | ICD-10-CM | POA: Diagnosis not present

## 2017-02-15 DIAGNOSIS — M4316 Spondylolisthesis, lumbar region: Secondary | ICD-10-CM | POA: Diagnosis not present

## 2017-02-15 DIAGNOSIS — Z4789 Encounter for other orthopedic aftercare: Secondary | ICD-10-CM | POA: Diagnosis not present

## 2017-02-22 DIAGNOSIS — M4316 Spondylolisthesis, lumbar region: Secondary | ICD-10-CM | POA: Diagnosis not present

## 2017-02-22 DIAGNOSIS — Z4789 Encounter for other orthopedic aftercare: Secondary | ICD-10-CM | POA: Diagnosis not present

## 2017-02-22 DIAGNOSIS — M48061 Spinal stenosis, lumbar region without neurogenic claudication: Secondary | ICD-10-CM | POA: Diagnosis not present

## 2017-02-27 DIAGNOSIS — M4316 Spondylolisthesis, lumbar region: Secondary | ICD-10-CM | POA: Diagnosis not present

## 2017-02-27 DIAGNOSIS — M48061 Spinal stenosis, lumbar region without neurogenic claudication: Secondary | ICD-10-CM | POA: Diagnosis not present

## 2017-02-27 DIAGNOSIS — Z4789 Encounter for other orthopedic aftercare: Secondary | ICD-10-CM | POA: Diagnosis not present

## 2017-03-06 DIAGNOSIS — M4316 Spondylolisthesis, lumbar region: Secondary | ICD-10-CM | POA: Diagnosis not present

## 2017-03-06 DIAGNOSIS — M48061 Spinal stenosis, lumbar region without neurogenic claudication: Secondary | ICD-10-CM | POA: Diagnosis not present

## 2017-03-06 DIAGNOSIS — Z4789 Encounter for other orthopedic aftercare: Secondary | ICD-10-CM | POA: Diagnosis not present

## 2017-03-14 DIAGNOSIS — Z4889 Encounter for other specified surgical aftercare: Secondary | ICD-10-CM | POA: Diagnosis not present

## 2017-03-14 DIAGNOSIS — T148XXD Other injury of unspecified body region, subsequent encounter: Secondary | ICD-10-CM | POA: Diagnosis not present

## 2017-03-15 DIAGNOSIS — H2513 Age-related nuclear cataract, bilateral: Secondary | ICD-10-CM | POA: Diagnosis not present

## 2017-04-28 DIAGNOSIS — E559 Vitamin D deficiency, unspecified: Secondary | ICD-10-CM | POA: Diagnosis not present

## 2017-04-28 DIAGNOSIS — Z125 Encounter for screening for malignant neoplasm of prostate: Secondary | ICD-10-CM | POA: Diagnosis not present

## 2017-04-28 DIAGNOSIS — E784 Other hyperlipidemia: Secondary | ICD-10-CM | POA: Diagnosis not present

## 2017-04-28 DIAGNOSIS — M109 Gout, unspecified: Secondary | ICD-10-CM | POA: Diagnosis not present

## 2017-04-28 DIAGNOSIS — R7301 Impaired fasting glucose: Secondary | ICD-10-CM | POA: Diagnosis not present

## 2017-05-03 DIAGNOSIS — Z1389 Encounter for screening for other disorder: Secondary | ICD-10-CM | POA: Diagnosis not present

## 2017-05-03 DIAGNOSIS — M79671 Pain in right foot: Secondary | ICD-10-CM | POA: Diagnosis not present

## 2017-05-03 DIAGNOSIS — Z6828 Body mass index (BMI) 28.0-28.9, adult: Secondary | ICD-10-CM | POA: Diagnosis not present

## 2017-05-03 DIAGNOSIS — K219 Gastro-esophageal reflux disease without esophagitis: Secondary | ICD-10-CM | POA: Diagnosis not present

## 2017-05-03 DIAGNOSIS — L738 Other specified follicular disorders: Secondary | ICD-10-CM | POA: Diagnosis not present

## 2017-05-03 DIAGNOSIS — Z23 Encounter for immunization: Secondary | ICD-10-CM | POA: Diagnosis not present

## 2017-05-03 DIAGNOSIS — K644 Residual hemorrhoidal skin tags: Secondary | ICD-10-CM | POA: Diagnosis not present

## 2017-05-03 DIAGNOSIS — E559 Vitamin D deficiency, unspecified: Secondary | ICD-10-CM | POA: Diagnosis not present

## 2017-05-03 DIAGNOSIS — Z0000 Encounter for general adult medical examination without abnormal findings: Secondary | ICD-10-CM | POA: Diagnosis not present

## 2017-05-03 DIAGNOSIS — E784 Other hyperlipidemia: Secondary | ICD-10-CM | POA: Diagnosis not present

## 2017-05-03 DIAGNOSIS — M109 Gout, unspecified: Secondary | ICD-10-CM | POA: Diagnosis not present

## 2017-05-03 DIAGNOSIS — R7301 Impaired fasting glucose: Secondary | ICD-10-CM | POA: Diagnosis not present

## 2017-05-03 DIAGNOSIS — M79609 Pain in unspecified limb: Secondary | ICD-10-CM | POA: Diagnosis not present

## 2017-06-22 DIAGNOSIS — Z23 Encounter for immunization: Secondary | ICD-10-CM | POA: Diagnosis not present

## 2017-06-22 DIAGNOSIS — Z85828 Personal history of other malignant neoplasm of skin: Secondary | ICD-10-CM | POA: Diagnosis not present

## 2017-06-22 DIAGNOSIS — L821 Other seborrheic keratosis: Secondary | ICD-10-CM | POA: Diagnosis not present

## 2017-06-22 DIAGNOSIS — Z86018 Personal history of other benign neoplasm: Secondary | ICD-10-CM | POA: Diagnosis not present

## 2017-06-22 DIAGNOSIS — L918 Other hypertrophic disorders of the skin: Secondary | ICD-10-CM | POA: Diagnosis not present

## 2017-06-22 DIAGNOSIS — Z808 Family history of malignant neoplasm of other organs or systems: Secondary | ICD-10-CM | POA: Diagnosis not present

## 2017-06-22 DIAGNOSIS — D2271 Melanocytic nevi of right lower limb, including hip: Secondary | ICD-10-CM | POA: Diagnosis not present

## 2017-06-22 DIAGNOSIS — D239 Other benign neoplasm of skin, unspecified: Secondary | ICD-10-CM | POA: Diagnosis not present

## 2017-07-04 DIAGNOSIS — M545 Low back pain: Secondary | ICD-10-CM | POA: Diagnosis not present

## 2017-07-04 DIAGNOSIS — M4326 Fusion of spine, lumbar region: Secondary | ICD-10-CM | POA: Diagnosis not present

## 2017-07-04 DIAGNOSIS — Z981 Arthrodesis status: Secondary | ICD-10-CM | POA: Diagnosis not present

## 2018-01-04 ENCOUNTER — Ambulatory Visit (INDEPENDENT_AMBULATORY_CARE_PROVIDER_SITE_OTHER): Payer: Medicare Other | Admitting: Urgent Care

## 2018-01-04 ENCOUNTER — Encounter: Payer: Self-pay | Admitting: Urgent Care

## 2018-01-04 ENCOUNTER — Other Ambulatory Visit: Payer: Self-pay

## 2018-01-04 VITALS — BP 122/62 | HR 80 | Temp 98.2°F | Ht 68.0 in | Wt 192.8 lb

## 2018-01-04 DIAGNOSIS — L258 Unspecified contact dermatitis due to other agents: Secondary | ICD-10-CM | POA: Diagnosis not present

## 2018-01-04 DIAGNOSIS — L299 Pruritus, unspecified: Secondary | ICD-10-CM | POA: Diagnosis not present

## 2018-01-04 DIAGNOSIS — S80812A Abrasion, left lower leg, initial encounter: Secondary | ICD-10-CM | POA: Diagnosis not present

## 2018-01-04 NOTE — Progress Notes (Signed)
    MRN: [id number] DOB: 07/22/1941  Subjective:   Marcus Richardson. is a 77 y.o. male presenting for a calf wound over his left leg that he suffered from hiking on Sunday.  Wound was itchy had clear drainage and was slightly red with some warmth.  It has been steadily improving and is at its best today.  Denies fever, headaches, red eyes, cough, sore throat, nausea, vomiting, shortness of breath, belly pain, malaise. Denies smoking cigarettes.  Patient does not know if it was a tick bite or just some random insect that caused the abrasion.  Marcus Richardson currently has no medications in their medication list. Also is allergic to tape.  Marcus Richardson  has a past medical history of Hyperlipidemia. Also  has a past surgical history that includes Replacement total hip w/  resurfacing implants (Right, 2005); Back surgery (12/30/2016); and Spinal fusion (12/30/2016).  Objective:   Vitals: BP 122/62 (BP Location: Right Arm, Patient Position: Sitting, Cuff Size: Normal)   Pulse 80   Temp 98.2 F (36.8 C) (Oral)   Ht 5\' 8"  (1.727 m)   Wt 192 lb 12.8 oz (87.5 kg)   SpO2 97%   BMI 29.32 kg/m   Physical Exam  Constitutional: He is oriented to person, place, and time. He appears well-developed and well-nourished.  Cardiovascular: Normal rate.  Pulmonary/Chest: Effort normal.  Musculoskeletal:       Legs: Neurological: He is alert and oriented to person, place, and time.   Assessment and Plan :   Abrasion of left leg, initial encounter  Contact dermatitis due to other agent, unspecified contact dermatitis type  Itching  Counseled patient is on signs of tickborne illness.  For now we will hold off on using any antibiotics for this.  Offered patient hydroxyzine for the itching and contact dermatitis associated with his insect bite.  He declined this so I recommended over-the-counter antihistamines.  Return to clinic as needed.  Marcus Eagles, PA-C [facility] at [facility] [phone] 01/04/2018  2:50 PM

## 2018-01-04 NOTE — Patient Instructions (Addendum)
Take Zyrtec 10mg  once daily with Zantac 150mg  twice daily otc for itching is reasonable.     Tick Bite Information, Adult Ticks are insects that draw blood for food. Most ticks live in shrubs and grassy areas. They climb onto people and animals that brush against the leaves and grasses that they rest on. Then they bite, attaching themselves to the skin. Most ticks are harmless, but some ticks carry germs that can spread to a person through a bite and cause a disease. To reduce your risk of getting a disease from a tick bite, it is important to take steps to prevent tick bites. It is also important to check for ticks after being outdoors. If you find that a tick has attached to you, watch for symptoms of disease. How can I prevent tick bites? Take these steps to help prevent tick bites when you are outdoors in an area where ticks are found:  Use insect repellent that has DEET (20% or higher), picaridin, or IR3535 in it. Use it on: ? Skin that is showing. ? The top of your boots. ? Your pant legs. ? Your sleeve cuffs.  For repellent products that contain permethrin, follow product instructions. Use these products on: ? Clothing. ? Gear. ? Boots. ? Tents.  Wear protective clothing. Long sleeves and long pants offer the best protection from ticks.  Wear light-colored clothing so you can see ticks more easily.  Tuck your pant legs into your socks.  If you go walking on a trail, stay in the middle of the trail so your skin, hair, and clothing do not touch the bushes.  Avoid walking through areas with long grass.  Check for ticks on your clothing, hair, and skin often while you are outside, and check again before you go inside. Make sure to check the places that ticks attach themselves most often. These places include the scalp, neck, armpits, waist, groin, and joint areas. Ticks that carry a disease called Lyme disease have to be attached to the skin for 24-48 hours. Checking for ticks  every day will lessen your risk of this and other diseases.  When you come indoors, wash your clothes and take a shower or a bath right away. Dry your clothes in a dryer on high heat for at least 60 minutes. This will kill any ticks in your clothes.  What is the proper way to remove a tick? If you find a tick on your body, remove it as soon as possible. Removing a tick sooner rather than later can prevent germs from passing from the tick to your body. To remove a tick that is crawling on your skin but has not bitten:  Go outdoors and brush the tick off.  Remove the tick with tape or a lint roller.  To remove a tick that is attached to your skin:  Wash your hands.  If you have latex gloves, put them on.  Use tweezers, curved forceps, or a tick-removal tool to gently grasp the tick as close to your skin and the tick's head as possible.  Gently pull with steady, upward pressure until the tick lets go. When removing the tick: ? Take care to keep the tick's head attached to its body. ? Do not twist or jerk the tick. This can make the tick's head or mouth break off. ? Do not squeeze or crush the tick's body. This could force disease-carrying fluids from the tick into your body.  Do not try to remove a  tick with heat, alcohol, petroleum jelly, or fingernail polish. Using these methods can cause the tick to salivate and regurgitate into your bloodstream, increasing your risk of getting a disease. What should I do after removing a tick?  Clean the bite area with soap and water, rubbing alcohol, or an iodine scrub.  If an antiseptic cream or ointment is available, apply a small amount to the bite site.  Wash and disinfect any instruments that you used to remove the tick. How should I dispose of a tick? To dispose of a live tick, use one of these methods:  Place it in rubbing alcohol.  Place it in a sealed bag or container.  Wrap it tightly in tape.  Flush it down the toilet.  Contact  a health care provider if:  You have symptoms of a disease after a tick bite. Symptoms of a tick-borne disease can occur from moments after the tick bites to up to 30 days after a tick is removed. Symptoms include: ? Muscle, joint, or bone pain. ? Difficulty walking or moving your legs. ? Numbness in the legs. ? Paralysis. ? Red rash around the tick bite area that is shaped like a target or a "bull's-eye." ? Redness and swelling in the area of the tick bite. ? Fever. ? Repeated vomiting. ? Diarrhea. ? Weight loss. ? Tender, swollen lymph glands. ? Shortness of breath. ? Cough. ? Pain in the abdomen. ? Headache. ? Abnormal tiredness. ? A change in your level of consciousness. ? Confusion. Get help right away if:  You are not able to remove a tick.  A part of a tick breaks off and gets stuck in your skin.  Your symptoms get worse. Summary  Ticks may carry germs that can spread to a person through a bite and cause disease.  Wear protective clothing and use insect repellent to prevent tick bites. Follow product instructions.  If you find a tick on your body, remove it as soon as possible. If the tick is attached, do not try to remove with heat, alcohol, petroleum jelly, or fingernail polish.  Remove the attached tick using tweezers, curved forceps, or a tick-removal tool. Gently pull with steady, upward pressure until the tick lets go. Do not twist or jerk the tick. Do not squeeze or crush the tick's body.  If you have symptoms after being bitten by a tick, contact a health care provider. This information is not intended to replace advice given to you by your health care provider. Make sure you discuss any questions you have with your health care provider. Document Released: 07/22/2000 Document Revised: 05/06/2016 Document Reviewed: 05/06/2016 Elsevier Interactive Patient Education  2018 [employer].     IF you received an x-ray today, you will receive an invoice from  [facility]. Please contact [facility] at [phone] with questions or concerns regarding your invoice.   IF you received labwork today, you will receive an invoice from [place]. Please contact [facility] at [phone] with questions or concerns regarding your invoice.   Our billing staff will not be able to assist you with questions regarding bills from these companies.  You will be contacted with the lab results as soon as they are available. The fastest way to get your results is to activate your My Chart account. Instructions are located on the last page of this paperwork. If you have not heard from [place] regarding the results in 2 weeks, please contact this office.

## 2018-03-06 ENCOUNTER — Encounter: Payer: Self-pay | Admitting: Urgent Care

## 2018-03-06 ENCOUNTER — Ambulatory Visit (INDEPENDENT_AMBULATORY_CARE_PROVIDER_SITE_OTHER): Payer: Medicare Other | Admitting: Urgent Care

## 2018-03-06 VITALS — BP 150/89 | HR 53 | Temp 98.3°F | Resp 18 | Ht 68.0 in | Wt 189.8 lb

## 2018-03-06 DIAGNOSIS — S80812D Abrasion, left lower leg, subsequent encounter: Secondary | ICD-10-CM

## 2018-03-06 DIAGNOSIS — S80812A Abrasion, left lower leg, initial encounter: Secondary | ICD-10-CM

## 2018-03-06 DIAGNOSIS — L258 Unspecified contact dermatitis due to other agents: Secondary | ICD-10-CM | POA: Diagnosis not present

## 2018-03-06 MED ORDER — TRIAMCINOLONE ACETONIDE 0.1 % EX CREA: 1.0000 "application " | TOPICAL_CREAM | Freq: Two times a day (BID) | CUTANEOUS | 0 refills | Status: DC

## 2018-03-06 NOTE — Patient Instructions (Addendum)
Triamcinolone skin cream, ointment, lotion, or aerosol What is this medicine? TRIAMCINOLONE (trye am SIN oh lone) is a corticosteroid. It is used on the skin to reduce swelling, redness, itching, and allergic reactions. This medicine may be used for other purposes; ask your health care provider or pharmacist if you have questions. COMMON BRAND NAME(S): Aristocort, Aristocort A, Aristocort HP, Cinalog, Cinolar, DERMASORB TA Complete, Flutex, Kenalog, Pediaderm TA, SP Rx 228, Triacet, Trianex, Triderm What should I tell my health care provider before I take this medicine? They need to know if you have any of these conditions: -diabetes -infection, like tuberculosis, herpes, or fungal infection -large areas of burned or damaged skin -skin wasting or thinning -an unusual or allergic reaction to triamcinolone, corticosteroids, other medicines, foods, dyes, or preservatives -pregnant or trying to get pregnant -breast-feeding How should I use this medicine? This medicine is for external use only. Do not take by mouth. Follow the directions on the prescription label. Wash your hands before and after use. Apply a thin film of medicine to the affected area. Do not cover with a bandage or dressing unless your doctor or health care professional tells you to. Do not use on healthy skin or over large areas of skin. Do not get this medicine in your eyes. If you do, rinse out with plenty of cool tap water. It is important not to use more medicine than prescribed. Do not use your medicine more often than directed. Talk to your pediatrician regarding the use of this medicine in children. Special care may be needed. Elderly patients are more likely to have damaged skin through aging, and this may increase side effects. This medicine should only be used for brief periods and infrequently in older patients. Overdosage: If you think you have taken too much of this medicine contact a poison control center or emergency  room at once. NOTE: This medicine is only for you. Do not share this medicine with others. What if I miss a dose? If you miss a dose, use it as soon as you can. If it is almost time for your next dose, use only that dose. Do not use double or extra doses. What may interact with this medicine? Interactions are not expected. This list may not describe all possible interactions. Give your health care provider a list of all the medicines, herbs, non-prescription drugs, or dietary supplements you use. Also tell them if you smoke, drink alcohol, or use illegal drugs. Some items may interact with your medicine. What should I watch for while using this medicine? Tell your doctor or health care professional if your symptoms do not start to get better within one week. Do not use for more than 14 days. Do not use on healthy skin or over large areas of skin. Tell your doctor or health care professional if you are exposed to anyone with measles or chickenpox, or if you develop sores or blisters that do not heal properly. Do not use an airtight bandage to cover the affected area unless your doctor or health care professional tells you to. If you are to cover the area, follow the instructions carefully. Covering the area where the medicine is applied can increase the amount that passes through the skin and increases the risk of side effects. If treating the diaper area of a child, avoid covering the treated area with tight-fitting diapers or plastic pants. This may increase the amount of medicine that passes through the skin and increase the risk of serious side   effects. What side effects may I notice from receiving this medicine? Side effects that you should report to your doctor or health care professional as soon as possible: -burning or itching of the skin -dark red spots on the skin -infection -painful, red, pus filled blisters in hair follicles -thinning of the skin, sunburn more likely especially on the  face Side effects that usually do not require medical attention (report to your doctor or health care professional if they continue or are bothersome): -dry skin, irritation -unusual increased growth of hair on the face or body This list may not describe all possible side effects. Call your doctor for medical advice about side effects. You may report side effects to FDA at 1-800-FDA-1088. Where should I keep my medicine? Keep out of the reach of children. Store at room temperature between 15 and 30 degrees C (59 and 86 degrees F). Do not freeze. Throw away any unused medicine after the expiration date. NOTE: This sheet is a summary. It may not cover all possible information. If you have questions about this medicine, talk to your doctor, pharmacist, or health care provider.  2018 Elsevier/Gold Standard (2013-11-14 15:59:51)     IF you received an x-ray today, you will receive an invoice from [facility]. Please contact [facility] at [phone] with questions or concerns regarding your invoice.   IF you received labwork today, you will receive an invoice from [place]. Please contact [facility] at [phone] with questions or concerns regarding your invoice.   Our billing staff will not be able to assist you with questions regarding bills from these companies.  You will be contacted with the lab results as soon as they are available. The fastest way to get your results is to activate your My Chart account. Instructions are located on the last page of this paperwork. If you have not heard from [place] regarding the results in 2 weeks, please contact this office.

## 2018-03-06 NOTE — Progress Notes (Signed)
    MRN: [id number] DOB: [birth year]  Subjective:   Marcus Richardson. is a 77 y.o. male presenting for persistent itching of her tick bite of his left calf.  Patient has not been using medications for relief.  Reports that it formed a tiny blister that ended up coming to ahead and opening on his own.  He denies fever, headache, nausea, vomiting, malaise, drainage of pus or persistent bleeding.  Delante not currently taking medications for relief.  Also is allergic to tape.  Tauheed  has a past medical history of Hyperlipidemia. Also  has a past surgical history that includes Replacement total hip w/  resurfacing implants (Right, 2005); Back surgery (12/30/2016); and Spinal fusion (12/30/2016).  Objective:   Vitals: BP (!) 150/89   Pulse (!) 53   Temp 98.3 F (36.8 C) (Oral)   Resp 18   Ht 5\' 8"  (1.727 m)   Wt 189 lb 12.8 oz (86.1 kg)   SpO2 95%   BMI 28.86 kg/m   BP Readings from Last 3 Encounters:  03/06/18 (!) 150/89  01/04/18 122/62  03/31/16 122/70   Physical Exam  Constitutional: He is oriented to person, place, and time. He appears well-developed and well-nourished.  Cardiovascular: Normal rate.  Pulmonary/Chest: Effort normal.  Musculoskeletal:       Legs: Neurological: He is alert and oriented to person, place, and time.    Assessment and Plan :   Abrasion of left leg, initial encounter  Contact dermatitis due to other agent, unspecified contact dermatitis type  Physical exam findings reassuring.  Will use triamcinolone cream for contact dermatitis secondary to the tick bite.  Return to clinic precautions reviewed.  Jaynee Eagles, PA-C [facility] at [facility] [phone] 03/06/2018  6:06 PM

## 2018-03-08 ENCOUNTER — Encounter: Payer: Self-pay | Admitting: Urgent Care

## 2018-03-14 DIAGNOSIS — R21 Rash and other nonspecific skin eruption: Secondary | ICD-10-CM | POA: Diagnosis not present

## 2018-03-14 DIAGNOSIS — Z6828 Body mass index (BMI) 28.0-28.9, adult: Secondary | ICD-10-CM | POA: Diagnosis not present

## 2018-03-16 DIAGNOSIS — H00021 Hordeolum internum right upper eyelid: Secondary | ICD-10-CM | POA: Diagnosis not present

## 2018-04-02 DIAGNOSIS — H2513 Age-related nuclear cataract, bilateral: Secondary | ICD-10-CM | POA: Diagnosis not present

## 2018-04-02 DIAGNOSIS — H0011 Chalazion right upper eyelid: Secondary | ICD-10-CM | POA: Diagnosis not present

## 2018-04-02 DIAGNOSIS — M79645 Pain in left finger(s): Secondary | ICD-10-CM | POA: Diagnosis not present

## 2018-04-02 DIAGNOSIS — H40012 Open angle with borderline findings, low risk, left eye: Secondary | ICD-10-CM | POA: Diagnosis not present

## 2018-05-02 ENCOUNTER — Ambulatory Visit: Payer: Medicare Other | Admitting: Urgent Care

## 2018-05-31 ENCOUNTER — Encounter: Payer: Self-pay | Admitting: Physician Assistant

## 2018-05-31 ENCOUNTER — Other Ambulatory Visit: Payer: Self-pay

## 2018-05-31 ENCOUNTER — Ambulatory Visit (INDEPENDENT_AMBULATORY_CARE_PROVIDER_SITE_OTHER): Payer: Medicare Other | Admitting: Physician Assistant

## 2018-05-31 VITALS — BP 138/72 | HR 78 | Temp 98.0°F | Resp 16 | Ht 68.0 in | Wt 190.6 lb

## 2018-05-31 DIAGNOSIS — L089 Local infection of the skin and subcutaneous tissue, unspecified: Secondary | ICD-10-CM | POA: Diagnosis not present

## 2018-05-31 DIAGNOSIS — S80812D Abrasion, left lower leg, subsequent encounter: Secondary | ICD-10-CM | POA: Diagnosis not present

## 2018-05-31 NOTE — Patient Instructions (Signed)
° ° ° °  If you have lab work done today you will be contacted with your lab results within the next 2 weeks.  If you have not heard from us then please contact us. The fastest way to get your results is to register for My Chart. ° ° °IF you received an x-ray today, you will receive an invoice from [place] [facility]. Please contact [facility] at [phone] with questions or concerns regarding your invoice.  ° °IF you received labwork today, you will receive an invoice from [facility]. Please contact [facility] at [phone] with questions or concerns regarding your invoice.  ° °Our billing staff will not be able to assist you with questions regarding bills from these companies. ° °You will be contacted with the lab results as soon as they are available. The fastest way to get your results is to activate your My Chart account. Instructions are located on the last page of this paperwork. If you have not heard from us regarding the results in 2 weeks, please contact this office. °  ° ° ° °

## 2018-05-31 NOTE — Progress Notes (Signed)
   Marcus Richardson.  MRN: [id number] DOB: 1940-10-30  PCP: Crist Infante, MD  Subjective:  Pt is a 77 year old male who presents to clinic for tick bite that hasn't healed  lower left calf since 01/01/18. He was prescribed triamcinolone cream 0.1% in 03/06/2018  Review of Systems  Constitutional: Negative for chills and fever.  Musculoskeletal: Negative for arthralgias and myalgias.  Skin: Positive for wound.    There are no active problems to display for this patient.   Current Outpatient Medications on File Prior to Visit  Medication Sig Dispense Refill  . triamcinolone cream (KENALOG) 0.1 % Apply 1 application topically 2 (two) times daily. 30 g 0   No current facility-administered medications on file prior to visit.     Allergies  Allergen Reactions  . Tape Rash     Objective:  BP 138/72 (BP Location: Left Arm, Patient Position: Sitting, Cuff Size: Normal)   Pulse 78   Temp 98 F (36.7 C) (Oral)   Resp 16   Ht 5\' 8"  (1.727 m)   Wt 190 lb 9.6 oz (86.5 kg)   SpO2 98%   BMI 28.98 kg/m   Physical Exam Vitals signs reviewed.  Constitutional:      Appearance: Normal appearance.  Skin:    General: Skin is warm and dry.     Findings: Lesion present.  Neurological:     Mental Status: He is alert.  Psychiatric:        Mood and Affect: Mood normal.        Thought Content: Thought content normal.        Judgment: Judgment normal.     Assessment and Plan :  1. Infected abrasion of left lower extremity, subsequent encounter - Pt presents c/o tick bite of left lower posterior leg that has not completely healed. No s/s RMSF. No skin/soft tissue red flags.  Con't apply topical triple antibiotic ointment. Advised appt with his dermatologist.    Mercer Pod, PA-C  [facility] at [place] 05/31/2018 2:38 PM  Please note: Portions of this report may have been transcribed using dragon voice recognition software. Every effort was made to ensure  accuracy; however, inadvertent computerized transcription errors may be present.

## 2018-06-07 DIAGNOSIS — E559 Vitamin D deficiency, unspecified: Secondary | ICD-10-CM | POA: Diagnosis not present

## 2018-06-07 DIAGNOSIS — R7301 Impaired fasting glucose: Secondary | ICD-10-CM | POA: Diagnosis not present

## 2018-06-07 DIAGNOSIS — R82998 Other abnormal findings in urine: Secondary | ICD-10-CM | POA: Diagnosis not present

## 2018-06-07 DIAGNOSIS — M109 Gout, unspecified: Secondary | ICD-10-CM | POA: Diagnosis not present

## 2018-06-07 DIAGNOSIS — E7849 Other hyperlipidemia: Secondary | ICD-10-CM | POA: Diagnosis not present

## 2018-06-07 DIAGNOSIS — Z125 Encounter for screening for malignant neoplasm of prostate: Secondary | ICD-10-CM | POA: Diagnosis not present

## 2018-06-13 DIAGNOSIS — Z1389 Encounter for screening for other disorder: Secondary | ICD-10-CM | POA: Diagnosis not present

## 2018-06-13 DIAGNOSIS — M109 Gout, unspecified: Secondary | ICD-10-CM | POA: Diagnosis not present

## 2018-06-13 DIAGNOSIS — M79671 Pain in right foot: Secondary | ICD-10-CM | POA: Diagnosis not present

## 2018-06-13 DIAGNOSIS — R7301 Impaired fasting glucose: Secondary | ICD-10-CM | POA: Diagnosis not present

## 2018-06-13 DIAGNOSIS — E7849 Other hyperlipidemia: Secondary | ICD-10-CM | POA: Diagnosis not present

## 2018-06-13 DIAGNOSIS — Z6828 Body mass index (BMI) 28.0-28.9, adult: Secondary | ICD-10-CM | POA: Diagnosis not present

## 2018-06-13 DIAGNOSIS — Z23 Encounter for immunization: Secondary | ICD-10-CM | POA: Diagnosis not present

## 2018-06-13 DIAGNOSIS — K644 Residual hemorrhoidal skin tags: Secondary | ICD-10-CM | POA: Diagnosis not present

## 2018-06-13 DIAGNOSIS — M5136 Other intervertebral disc degeneration, lumbar region: Secondary | ICD-10-CM | POA: Diagnosis not present

## 2018-06-13 DIAGNOSIS — K4090 Unilateral inguinal hernia, without obstruction or gangrene, not specified as recurrent: Secondary | ICD-10-CM | POA: Diagnosis not present

## 2018-06-13 DIAGNOSIS — M169 Osteoarthritis of hip, unspecified: Secondary | ICD-10-CM | POA: Diagnosis not present

## 2018-06-13 DIAGNOSIS — M859 Disorder of bone density and structure, unspecified: Secondary | ICD-10-CM | POA: Diagnosis not present

## 2018-06-13 DIAGNOSIS — E559 Vitamin D deficiency, unspecified: Secondary | ICD-10-CM | POA: Diagnosis not present

## 2018-06-13 DIAGNOSIS — Z0000 Encounter for general adult medical examination without abnormal findings: Secondary | ICD-10-CM | POA: Diagnosis not present

## 2018-06-20 DIAGNOSIS — D225 Melanocytic nevi of trunk: Secondary | ICD-10-CM | POA: Diagnosis not present

## 2018-06-20 DIAGNOSIS — D2221 Melanocytic nevi of right ear and external auricular canal: Secondary | ICD-10-CM | POA: Diagnosis not present

## 2018-06-20 DIAGNOSIS — Z23 Encounter for immunization: Secondary | ICD-10-CM | POA: Diagnosis not present

## 2018-06-20 DIAGNOSIS — Z808 Family history of malignant neoplasm of other organs or systems: Secondary | ICD-10-CM | POA: Diagnosis not present

## 2018-06-20 DIAGNOSIS — L821 Other seborrheic keratosis: Secondary | ICD-10-CM | POA: Diagnosis not present

## 2018-06-20 DIAGNOSIS — Z86018 Personal history of other benign neoplasm: Secondary | ICD-10-CM | POA: Diagnosis not present

## 2018-06-20 DIAGNOSIS — D2271 Melanocytic nevi of right lower limb, including hip: Secondary | ICD-10-CM | POA: Diagnosis not present

## 2018-06-22 DIAGNOSIS — Z1212 Encounter for screening for malignant neoplasm of rectum: Secondary | ICD-10-CM | POA: Diagnosis not present

## 2018-10-01 DIAGNOSIS — M25512 Pain in left shoulder: Secondary | ICD-10-CM | POA: Diagnosis not present

## 2019-03-18 DIAGNOSIS — Z03818 Encounter for observation for suspected exposure to other biological agents ruled out: Secondary | ICD-10-CM | POA: Diagnosis not present

## 2019-03-19 DIAGNOSIS — M79675 Pain in left toe(s): Secondary | ICD-10-CM | POA: Diagnosis not present

## 2019-03-19 DIAGNOSIS — L0889 Other specified local infections of the skin and subcutaneous tissue: Secondary | ICD-10-CM | POA: Diagnosis not present

## 2019-04-11 DIAGNOSIS — H2513 Age-related nuclear cataract, bilateral: Secondary | ICD-10-CM | POA: Diagnosis not present

## 2019-04-11 DIAGNOSIS — H40012 Open angle with borderline findings, low risk, left eye: Secondary | ICD-10-CM | POA: Diagnosis not present

## 2019-05-13 DIAGNOSIS — Z23 Encounter for immunization: Secondary | ICD-10-CM | POA: Diagnosis not present

## 2019-05-15 ENCOUNTER — Encounter: Payer: Self-pay | Admitting: Sports Medicine

## 2019-05-15 ENCOUNTER — Ambulatory Visit (INDEPENDENT_AMBULATORY_CARE_PROVIDER_SITE_OTHER): Payer: Medicare Other | Admitting: Sports Medicine

## 2019-05-15 ENCOUNTER — Other Ambulatory Visit: Payer: Self-pay

## 2019-05-15 VITALS — BP 148/82 | Ht 69.0 in | Wt 184.0 lb

## 2019-05-15 DIAGNOSIS — M25561 Pain in right knee: Secondary | ICD-10-CM

## 2019-05-15 NOTE — Progress Notes (Addendum)
[facility] [address] [place], [place] [address] Phone: [phone] Fax: [phone]   Patient Name: Marcus Richardson. Date of Birth: Nov 15, 1940 Medical Record Number: [id number] Gender: male Date of Encounter: 05/15/2019  CC: Knee pain and swelling  HPI: Marcus Richardson is a 78 year old gentleman presenting with right knee pain and swelling.  1 month ago he drove round trip to [place], 12 hours each way, and complained of superior lateral swelling and stiffness after the car rides.  Last week he also went hiking 3 times, including a 10 mile hike on Saturday.  The pain and swelling has improved over that time with Aleve and rest.  He denies any radiating pain and instability.  Not injured the knee before.  Rating factors include terminal knee flexion.  Past surgical history includes right hip THA in 2005 and back surgery in 2018. He states his friend told him he could have a DVT.  He denies any warmth or erythema to the calf.  States he is always had medial calf tightness.  Past Medical History:  Diagnosis Date  . Hyperlipidemia    no meds    Current Outpatient Medications on File Prior to Visit  Medication Sig Dispense Refill  . triamcinolone cream (KENALOG) 0.1 % Apply 1 application topically 2 (two) times daily. (Patient not taking: Reported on 05/15/2019) 30 g 0   No current facility-administered medications on file prior to visit.     Past Surgical History:  Procedure Laterality Date  . BACK SURGERY  12/30/2016  . REPLACEMENT TOTAL HIP W/  RESURFACING IMPLANTS Right 2005  . SPINAL FUSION  12/30/2016    Allergies  Allergen Reactions  . Tape Rash    Social History   Socioeconomic History  . Marital status: Married    Spouse name: Not on file  . Number of children: Not on file  . Years of education: Not on file  . Highest education level: Not on file  Occupational History  . Not on file  Social Needs  . Financial resource strain: Not  on file  . Food insecurity    Worry: Not on file    Inability: Not on file  . Transportation needs    Medical: Not on file    Non-medical: Not on file  Tobacco Use  . Smoking status: Never Smoker  . Smokeless tobacco: Never Used  Substance and Sexual Activity  . Alcohol use: Yes    Alcohol/week: 7.0 standard drinks    Types: 7 Glasses of wine per week  . Drug use: No  . Sexual activity: Not on file  Lifestyle  . Physical activity    Days per week: Not on file    Minutes per session: Not on file  . Stress: Not on file  Relationships  . Social Herbalist on phone: Not on file    Gets together: Not on file    Attends religious service: Not on file    Active member of club or organization: Not on file    Attends meetings of clubs or organizations: Not on file    Relationship status: Not on file  . Intimate partner violence    Fear of current or ex partner: Not on file    Emotionally abused: Not on file    Physically abused: Not on file    Forced sexual activity: Not on file  Other Topics Concern  . Not on file  Social History Narrative  . Not on  file    Family History  Problem Relation Age of Onset  . Hyperlipidemia Mother   . Depression Sister   . Hypertension Brother   . Hyperlipidemia Brother   . Colon cancer Neg Hx   . Esophageal cancer Neg Hx   . Rectal cancer Neg Hx   . Stomach cancer Neg Hx     BP (!) 148/82   Ht 5\' 9"  (1.753 m)   Wt 184 lb (83.5 kg)   BMI 27.17 kg/m   ROS:  See HPI CONST: no F/C, no malaise, no fatigue MSK: See above NEURO: no numbness/tingling, no weakness SKIN: no rash, no lesions HEME: no bleeding, no bruising, no erythema  Objective: GEN: Alert and oriented, NAD Pulm: Breathing unlabored PSY: normal mood, congruent affect  Right knee: Normal to inspection with no erythema or effusion or obvious bony abnormalities. Palpation normal with no warmth, joint line tenderness, patellar tenderness, or condyle  tenderness. ROM full in flexion.  Lacks terminal knee extension Ligaments with solid consistent endpoints including ACL, PCL, LCL, MCL. Negative Mcmurray's and Thessaly tests. Non painful patellar compression. Patellar glide without crepitus. Patellar and quadriceps tendons unremarkable. Hamstring and quadriceps strength is normal.  Neurovascularly intact.  Left knee: Normal to inspection with no erythema or effusion or obvious bony abnormalities. Palpation normal with no warmth, joint line tenderness, patellar tenderness, or condyle tenderness. ROM full in flexion and extension and lower leg rotation. Ligaments with solid consistent endpoints including ACL, PCL, LCL, MCL. Negative Mcmurray's and Thessaly tests. Non painful patellar compression. Patellar glide without crepitus. Patellar and quadriceps tendons unremarkable. Hamstring and quadriceps strength is normal.  Neurovascularly intact.  MSK right Limited knee ultrasound performed,  Suprapatellar pouch visualized in long and short axis demonstrating septated effusion without crystals Quadriceps tendon visualized in both long and short axis without abnormality, slight thickening Patellar Tendon is visualized in long and short axis without abnormality Medial meniscus and MCL visualized with no abnormality Lateral mensicus and LCL visualized with no abnormality Popliteal fossa was visualized, no Baker's cyst Visualized in long and short axis with help abnormality. Veins in popliteal fossa and lower extremity are compressible.   IMPRESSION:  Suprapatellar effusion without signs of a deep vein thrombosis    Assessment and Plan:  1.  Right knee pain  Patient has underlying osteoarthritis.  Even that his symptoms have improved with conservative management, we have fitted him with a helix knee brace to wear when hiking.  He is to start an HEP.  Take Aleve daily and bring with him on his hike next week.  I will leave follow-up up  to him.  If he returns with worsening symptoms, we will likely need x-ray and can consider injections at that time.   Lanier Clam, DO, ATC Sports Medicine Fellow  Addendum:  Patient seen in the office by fellow.  His history, exam, plan of care were precepted with me.  Karlton Lemon MD Marcus Richardson

## 2019-05-18 ENCOUNTER — Other Ambulatory Visit: Payer: Self-pay

## 2019-05-18 ENCOUNTER — Ambulatory Visit (HOSPITAL_COMMUNITY)
Admission: EM | Admit: 2019-05-18 | Discharge: 2019-05-18 | Disposition: A | Discharge status: Home / Self Care | Payer: Medicare Other | Attending: Emergency Medicine | Admitting: Emergency Medicine

## 2019-05-18 ENCOUNTER — Encounter (HOSPITAL_COMMUNITY): Payer: Self-pay

## 2019-05-18 DIAGNOSIS — H6123 Impacted cerumen, bilateral: Secondary | ICD-10-CM

## 2019-05-18 NOTE — Discharge Instructions (Addendum)
Follow up with your primary care provider if your symptoms are not improving or get worse.

## 2019-05-18 NOTE — ED Provider Notes (Signed)
Marcus Richardson    CSN: [id number] Arrival date & time: 05/18/19  1340      History   Chief Complaint Chief Complaint  Patient presents with  . Ear Fullness    HPI Marcus Richardson. is a 78 y.o. male.   Patient presents with 2-day history of decreased hearing and feeling like both of his ears are clogged with earwax.  He denies fever, chills, ear pain, sore throat, cough, shortness of breath, or other symptoms.  He states he has to get his earwax cleaned out about every 5 years.    The history is provided by the patient.    Past Medical History:  Diagnosis Date  . Hyperlipidemia    no meds    There are no active problems to display for this patient.   Past Surgical History:  Procedure Laterality Date  . BACK SURGERY  12/30/2016  . REPLACEMENT TOTAL HIP W/  RESURFACING IMPLANTS Right 2005  . SPINAL FUSION  12/30/2016       Home Medications    Prior to Admission medications   Medication Sig Start Date End Date Taking? Authorizing Provider  triamcinolone cream (KENALOG) 0.1 % Apply 1 application topically 2 (two) times daily. Patient not taking: Reported on 05/15/2019 03/06/18   Jaynee Eagles, PA-C    Family History Family History  Problem Relation Age of Onset  . Hyperlipidemia Mother   . Depression Sister   . Hypertension Brother   . Hyperlipidemia Brother   . Colon cancer Neg Hx   . Esophageal cancer Neg Hx   . Rectal cancer Neg Hx   . Stomach cancer Neg Hx     Social History Social History   Tobacco Use  . Smoking status: Never Smoker  . Smokeless tobacco: Never Used  Substance Use Topics  . Alcohol use: Yes    Alcohol/week: 7.0 standard drinks    Types: 7 Glasses of wine per week  . Drug use: No     Allergies   Tape   Review of Systems Review of Systems  Constitutional: Negative for chills and fever.  HENT: Positive for hearing loss. Negative for ear pain and sore throat.   Eyes: Negative for pain and visual disturbance.   Respiratory: Negative for cough and shortness of breath.   Cardiovascular: Negative for chest pain and palpitations.  Gastrointestinal: Negative for abdominal pain and vomiting.  Genitourinary: Negative for dysuria and hematuria.  Musculoskeletal: Negative for arthralgias and back pain.  Skin: Negative for color change and rash.  Neurological: Negative for seizures and syncope.  All other systems reviewed and are negative.    Physical Exam Triage Vital Signs ED Triage Vitals  Enc Vitals Group     BP 05/18/19 1355 115/63     Pulse Rate 05/18/19 1355 63     Resp 05/18/19 1355 16     Temp 05/18/19 1355 98.6 F (37 C)     Temp Source 05/18/19 1355 Skin     SpO2 05/18/19 1355 99 %     Weight --      Height --      Head Circumference --      Peak Flow --      Pain Score 05/18/19 1354 0     Pain Loc --      Pain Edu? --      Excl. in [place]? --    No data found.  Updated Vital Signs BP 115/63 (BP Location: Right Arm)   Pulse 63  Temp 98.6 F (37 C) (Skin)   Resp 16   SpO2 99%   Visual Acuity Right Eye Distance:   Left Eye Distance:   Bilateral Distance:    Right Eye Near:   Left Eye Near:    Bilateral Near:     Physical Exam Vitals signs and nursing note reviewed.  Constitutional:      Appearance: He is well-developed.  HENT:     Head: Normocephalic and atraumatic.     Right Ear: Tympanic membrane normal. There is impacted cerumen.     Left Ear: Tympanic membrane normal. There is impacted cerumen.     Nose: Nose normal.     Mouth/Throat:     Mouth: Mucous membranes are moist.     Pharynx: Oropharynx is clear.  Eyes:     Conjunctiva/sclera: Conjunctivae normal.  Neck:     Musculoskeletal: Neck supple.  Cardiovascular:     Rate and Rhythm: Normal rate and regular rhythm.     Heart sounds: No murmur.  Pulmonary:     Effort: Pulmonary effort is normal. No respiratory distress.     Breath sounds: Normal breath sounds.  Abdominal:     Palpations: Abdomen is  soft.     Tenderness: There is no abdominal tenderness. There is no guarding or rebound.  Skin:    General: Skin is warm and dry.  Neurological:     Mental Status: He is alert.      UC Treatments / Results  Labs (all labs ordered are listed, but only abnormal results are displayed) Labs Reviewed - No data to display  EKG   Radiology No results found.  Procedures Procedures (including critical care time)  Medications Ordered in UC Medications - No data to display  Initial Impression / Assessment and Plan / UC Course  I have reviewed the triage vital signs and the nursing notes.  Pertinent labs & imaging results that were available during my care of the patient were reviewed by me and considered in my medical decision making (see chart for details).   Bilateral impacted cerumen.  Removed via irrigation by nurse.  TMs and canals clear after cerumen removed.  Patient reports relief of his symptoms.  Instructed patient to follow-up with his PCP as needed.     Final Clinical Impressions(s) / UC Diagnoses   Final diagnoses:  Bilateral impacted cerumen     Discharge Instructions     Follow up with your primary care provider if your symptoms are not improving or get worse.        ED Prescriptions    None     PDMP not reviewed this encounter.   Sharion Balloon, NP 05/18/19 1500

## 2019-05-18 NOTE — ED Triage Notes (Signed)
Pt present both ears are clogged and difficulty to hear. Symptoms started two day ago.

## 2019-07-02 DIAGNOSIS — Z86018 Personal history of other benign neoplasm: Secondary | ICD-10-CM | POA: Diagnosis not present

## 2019-07-02 DIAGNOSIS — L821 Other seborrheic keratosis: Secondary | ICD-10-CM | POA: Diagnosis not present

## 2019-07-02 DIAGNOSIS — D225 Melanocytic nevi of trunk: Secondary | ICD-10-CM | POA: Diagnosis not present

## 2019-07-02 DIAGNOSIS — Z808 Family history of malignant neoplasm of other organs or systems: Secondary | ICD-10-CM | POA: Diagnosis not present

## 2019-07-02 DIAGNOSIS — Z23 Encounter for immunization: Secondary | ICD-10-CM | POA: Diagnosis not present

## 2019-07-02 DIAGNOSIS — D2271 Melanocytic nevi of right lower limb, including hip: Secondary | ICD-10-CM | POA: Diagnosis not present

## 2019-07-24 DIAGNOSIS — R8290 Unspecified abnormal findings in urine: Secondary | ICD-10-CM | POA: Diagnosis not present

## 2019-08-01 DIAGNOSIS — Z1212 Encounter for screening for malignant neoplasm of rectum: Secondary | ICD-10-CM | POA: Diagnosis not present

## 2019-08-09 DIAGNOSIS — Z87442 Personal history of urinary calculi: Secondary | ICD-10-CM

## 2019-08-09 HISTORY — DX: Personal history of urinary calculi: Z87.442

## 2019-09-08 ENCOUNTER — Ambulatory Visit: Payer: Medicare Other

## 2019-09-13 ENCOUNTER — Ambulatory Visit: Payer: Medicare Other

## 2019-09-22 ENCOUNTER — Ambulatory Visit: Payer: Medicare Other

## 2019-09-29 ENCOUNTER — Emergency Department (HOSPITAL_COMMUNITY)
Admission: EM | Admit: 2019-09-29 | Discharge: 2019-09-30 | Disposition: A | Discharge status: Home / Self Care | Payer: Medicare Other | Attending: Emergency Medicine | Admitting: Emergency Medicine

## 2019-09-29 ENCOUNTER — Ambulatory Visit: Payer: Medicare Other

## 2019-09-29 ENCOUNTER — Encounter (HOSPITAL_COMMUNITY): Payer: Self-pay

## 2019-09-29 ENCOUNTER — Other Ambulatory Visit: Payer: Self-pay

## 2019-09-29 DIAGNOSIS — N201 Calculus of ureter: Secondary | ICD-10-CM | POA: Insufficient documentation

## 2019-09-29 DIAGNOSIS — R110 Nausea: Secondary | ICD-10-CM | POA: Diagnosis not present

## 2019-09-29 DIAGNOSIS — N200 Calculus of kidney: Secondary | ICD-10-CM | POA: Diagnosis not present

## 2019-09-29 DIAGNOSIS — Z96641 Presence of right artificial hip joint: Secondary | ICD-10-CM | POA: Diagnosis not present

## 2019-09-29 DIAGNOSIS — R52 Pain, unspecified: Secondary | ICD-10-CM | POA: Diagnosis not present

## 2019-09-29 DIAGNOSIS — R1084 Generalized abdominal pain: Secondary | ICD-10-CM | POA: Diagnosis not present

## 2019-09-29 DIAGNOSIS — R1032 Left lower quadrant pain: Secondary | ICD-10-CM | POA: Diagnosis present

## 2019-09-29 LAB — COMPREHENSIVE METABOLIC PANEL
ALT: 20 U/L (ref 0–44)
AST: 22 U/L (ref 15–41)
Albumin: 4 g/dL (ref 3.5–5.0)
Alkaline Phosphatase: 64 U/L (ref 38–126)
Anion gap: 9 (ref 5–15)
BUN: 21 mg/dL (ref 8–23)
CO2: 28 mmol/L (ref 22–32)
Calcium: 9 mg/dL (ref 8.9–10.3)
Chloride: 103 mmol/L (ref 98–111)
Creatinine, Ser: 1.26 mg/dL — ABNORMAL HIGH (ref 0.61–1.24)
GFR calc Af Amer: >60 mL/min (ref >60)
GFR calc non Af Amer: 55 mL/min — ABNORMAL LOW (ref >60)
Glucose, Bld: 129 mg/dL — ABNORMAL HIGH (ref 70–99)
Potassium: 4.1 mmol/L (ref 3.5–5.1)
Sodium: 140 mmol/L (ref 135–145)
Total Bilirubin: 0.7 mg/dL (ref 0.3–1.2)
Total Protein: 7.1 g/dL (ref 6.5–8.1)

## 2019-09-29 LAB — LIPASE, BLOOD: Lipase: 34 U/L (ref 11–51)

## 2019-09-29 LAB — URINALYSIS, ROUTINE W REFLEX MICROSCOPIC
Bilirubin Urine: NEGATIVE
Glucose, UA: NEGATIVE mg/dL
Hgb urine dipstick: NEGATIVE
Ketones, ur: NEGATIVE mg/dL
Leukocytes,Ua: NEGATIVE
Nitrite: NEGATIVE
Protein, ur: NEGATIVE mg/dL
Specific Gravity, Urine: 1.019 (ref 1.005–1.030)
pH: 7 (ref 5.0–8.0)

## 2019-09-29 LAB — CBC
HCT: 47.7 % (ref 39.0–52.0)
Hemoglobin: 15.8 g/dL (ref 13.0–17.0)
MCH: 31.4 pg (ref 26.0–34.0)
MCHC: 33.1 g/dL (ref 30.0–36.0)
MCV: 94.8 fL (ref 80.0–100.0)
Platelets: 190 10*3/uL (ref 150–400)
RBC: 5.03 MIL/uL (ref 4.22–5.81)
RDW: 12.7 % (ref 11.5–15.5)
WBC: 7.6 10*3/uL (ref 4.0–10.5)
nRBC: 0 % (ref 0.0–0.2)

## 2019-09-29 MED ORDER — SODIUM CHLORIDE 0.9% FLUSH: 3.0000 mL | Freq: Once | INTRAVENOUS | Status: DC

## 2019-09-29 NOTE — ED Triage Notes (Addendum)
Pt BIB [facility] from home with c/o 7/10 LLQ pain that has been going on since this morning, increased in the last hour. Pt endorses nausea, no vomiting. Pt states he has not experienced this pain before. Pt is A&O x4.

## 2019-09-29 NOTE — ED Notes (Signed)
Pt standing at bedside and attempting to provide urine specimen.

## 2019-09-29 NOTE — ED Notes (Signed)
Pt has urinal at bedside and knows a specimen is needed.

## 2019-09-30 ENCOUNTER — Ambulatory Visit: Payer: Medicare Other

## 2019-09-30 ENCOUNTER — Encounter (HOSPITAL_COMMUNITY): Payer: Self-pay

## 2019-09-30 ENCOUNTER — Emergency Department (HOSPITAL_COMMUNITY): Payer: Medicare Other

## 2019-09-30 DIAGNOSIS — N201 Calculus of ureter: Secondary | ICD-10-CM | POA: Diagnosis not present

## 2019-09-30 DIAGNOSIS — N200 Calculus of kidney: Secondary | ICD-10-CM | POA: Diagnosis not present

## 2019-09-30 MED ORDER — SODIUM CHLORIDE (PF) 0.9 % IJ SOLN
INTRAMUSCULAR | Status: AC
  Filled 2019-09-30: qty 50

## 2019-09-30 MED ORDER — TAMSULOSIN HCL 0.4 MG PO CAPS: 0.4000 mg | ORAL_CAPSULE | Freq: Every day | ORAL | 0 refills | Status: DC

## 2019-09-30 MED ORDER — OXYCODONE-ACETAMINOPHEN 5-325 MG PO TABS: 1.0000 | ORAL_TABLET | Freq: Four times a day (QID) | ORAL | 0 refills | Status: DC | PRN

## 2019-09-30 MED ORDER — IOHEXOL 300 MG/ML  SOLN
100.0000 mL | Freq: Once | INTRAMUSCULAR | Status: AC | PRN
  Administered 2019-09-30: 01:00:00 100 mL via INTRAVENOUS

## 2019-09-30 NOTE — ED Provider Notes (Signed)
[facility] DEPT Provider Note   CSN: [id number] Arrival date & time: 09/29/19  2221     History Chief Complaint  Patient presents with  . Abdominal Pain    Marcus Richardson. is a 79 y.o. male.  Patient to ED with LLQ abdominal pain that started this morning. It has remained constant, nonradiating through the day, worsening tonight prompting ED evaluation. No fever. No difficulty urinating, flank or groin pain, He denies history of the same. He endorses nausea without vomiting and denies diarrhea. Last bowel movement was this morning. He denies significant medical history or daily medications. He has had a prior appendectomy but no other abdominal surgeries.   The history is provided by the patient. No language interpreter was used.  Abdominal Pain Associated symptoms: nausea   Associated symptoms: no chest pain, no chills, no constipation, no cough, no diarrhea, no dysuria, no fever, no shortness of breath and no vomiting        Past Medical History:  Diagnosis Date  . Hyperlipidemia    no meds    There are no problems to display for this patient.   Past Surgical History:  Procedure Laterality Date  . BACK SURGERY  12/30/2016  . REPLACEMENT TOTAL HIP W/  RESURFACING IMPLANTS Right 2005  . SPINAL FUSION  12/30/2016       Family History  Problem Relation Age of Onset  . Hyperlipidemia Mother   . Depression Sister   . Hypertension Brother   . Hyperlipidemia Brother   . Colon cancer Neg Hx   . Esophageal cancer Neg Hx   . Rectal cancer Neg Hx   . Stomach cancer Neg Hx     Social History   Tobacco Use  . Smoking status: Never Smoker  . Smokeless tobacco: Never Used  Substance Use Topics  . Alcohol use: Yes    Alcohol/week: 7.0 standard drinks    Types: 7 Glasses of wine per week  . Drug use: No    Home Medications Prior to Admission medications   Medication Sig Start Date End Date Taking? Authorizing Provider    triamcinolone cream (KENALOG) 0.1 % Apply 1 application topically 2 (two) times daily. Patient not taking: Reported on 05/15/2019 03/06/18   Jaynee Eagles, PA-C    Allergies    Tape  Review of Systems   Review of Systems  Constitutional: Negative for chills and fever.  HENT: Negative.   Respiratory: Negative.  Negative for cough and shortness of breath.   Cardiovascular: Negative.  Negative for chest pain.  Gastrointestinal: Positive for abdominal pain and nausea. Negative for constipation, diarrhea and vomiting.  Genitourinary: Negative.  Negative for dysuria, flank pain, scrotal swelling and testicular pain.  Musculoskeletal: Negative.   Skin: Negative.   Neurological: Negative.     Physical Exam Updated Vital Signs BP (!) 176/87 (BP Location: Left Arm)   Pulse (!) 45   Temp 98.5 F (36.9 C) (Oral)   Resp 16   Ht 5\' 8"  (1.727 m)   Wt 83.5 kg   SpO2 98%   BMI 27.98 kg/m   Physical Exam Vitals and nursing note reviewed.  Constitutional:      Appearance: He is well-developed.  HENT:     Head: Normocephalic.  Cardiovascular:     Rate and Rhythm: Normal rate and regular rhythm.  Pulmonary:     Effort: Pulmonary effort is normal.     Breath sounds: Normal breath sounds.  Abdominal:     General:  Bowel sounds are normal.     Palpations: Abdomen is soft.     Tenderness: There is abdominal tenderness in the left lower quadrant. There is guarding. There is no rebound.     Hernia: No hernia is present.  Musculoskeletal:        General: Normal range of motion.     Cervical back: Normal range of motion and neck supple.  Skin:    General: Skin is warm and dry.     Findings: No rash.  Neurological:     Mental Status: He is alert and oriented to person, place, and time.     ED Results / Procedures / Treatments   Labs (all labs ordered are listed, but only abnormal results are displayed) Labs Reviewed  COMPREHENSIVE METABOLIC PANEL - Abnormal; Notable for the following  components:      Result Value   Glucose, Bld 129 (*)    Creatinine, Ser 1.26 (*)    GFR calc non Af Amer 55 (*)    All other components within normal limits  LIPASE, BLOOD  CBC  URINALYSIS, ROUTINE W REFLEX MICROSCOPIC    EKG None  Radiology No results found.  Procedures Procedures (including critical care time)  Medications Ordered in ED Medications  sodium chloride flush (NS) 0.9 % injection 3 mL (0 mLs Intravenous Hold 09/29/19 2239)    ED Course  I have reviewed the triage vital signs and the nursing notes.  Pertinent labs & imaging results that were available during my care of the patient were reviewed by me and considered in my medical decision making (see chart for details).    MDM Rules/Calculators/A&P                      Patient to ED with LLQ abdominal pain as described in the HPI.   He is tender on exam but reports pain is improved from earlier. He declines pain medication. Labs are unremarkable, including UA. No evidence of infection.   CT scan indicated for further evaluation given tenderness and age. There is a finding of a 7 mm ureteral stone proximal left.   On re-evaluation, the patient is comfortable. No further symptoms of pain although he is still mildly tender to palpation. VSS.   Discussed CT findings and the need to urology follow up as he will likely need intervention to remove the stone. Will provide written Rx's pain medication as he states he doubts he will fill it. Rx Flomax provided as well. Will refer to urology for further treatment. Discussed strict return precautions.  Final Clinical Impression(s) / ED Diagnoses Final diagnoses:  None   1. Left ureteral stone  Rx / DC Orders ED Discharge Orders    None       Charlann Lange, PA-C 09/30/19 [place], Delice Bison, DO 09/30/19 0150

## 2019-09-30 NOTE — ED Notes (Signed)
Pt transported to CT ?

## 2019-09-30 NOTE — Discharge Instructions (Addendum)
You have a 7 mm kidney stone that will likely need intervention to pass. Please call [facility] to schedule an appointment as soon as possible. Take Percocet if you have pain, and take Flomax as directed.   If you develop a fever, have severe/uncontrolled pain or new concern, please return to the emergency department.

## 2019-10-02 DIAGNOSIS — N201 Calculus of ureter: Secondary | ICD-10-CM | POA: Diagnosis not present

## 2019-10-08 DIAGNOSIS — N201 Calculus of ureter: Secondary | ICD-10-CM | POA: Diagnosis not present

## 2019-10-18 DIAGNOSIS — N201 Calculus of ureter: Secondary | ICD-10-CM | POA: Diagnosis not present

## 2019-10-18 DIAGNOSIS — N130 Hydronephrosis with ureteropelvic junction obstruction: Secondary | ICD-10-CM | POA: Diagnosis not present

## 2019-10-31 DIAGNOSIS — N201 Calculus of ureter: Secondary | ICD-10-CM | POA: Diagnosis not present

## 2019-10-31 DIAGNOSIS — N1339 Other hydronephrosis: Secondary | ICD-10-CM | POA: Diagnosis not present

## 2019-11-05 DIAGNOSIS — N130 Hydronephrosis with ureteropelvic junction obstruction: Secondary | ICD-10-CM | POA: Diagnosis not present

## 2019-11-05 DIAGNOSIS — N201 Calculus of ureter: Secondary | ICD-10-CM | POA: Diagnosis not present

## 2019-12-24 DIAGNOSIS — N202 Calculus of kidney with calculus of ureter: Secondary | ICD-10-CM | POA: Diagnosis not present

## 2020-01-17 ENCOUNTER — Ambulatory Visit (HOSPITAL_COMMUNITY)
Admission: EM | Admit: 2020-01-17 | Discharge: 2020-01-17 | Disposition: A | Discharge status: Home / Self Care | Payer: Medicare Other | Attending: Internal Medicine | Admitting: Internal Medicine

## 2020-01-17 ENCOUNTER — Encounter (HOSPITAL_COMMUNITY): Payer: Self-pay

## 2020-01-17 ENCOUNTER — Other Ambulatory Visit: Payer: Self-pay

## 2020-01-17 DIAGNOSIS — W57XXXA Bitten or stung by nonvenomous insect and other nonvenomous arthropods, initial encounter: Secondary | ICD-10-CM

## 2020-01-17 DIAGNOSIS — S70362A Insect bite (nonvenomous), left thigh, initial encounter: Secondary | ICD-10-CM | POA: Diagnosis not present

## 2020-01-17 MED ORDER — DOXYCYCLINE HYCLATE 100 MG PO CAPS: 100.0000 mg | ORAL_CAPSULE | Freq: Two times a day (BID) | ORAL | 0 refills | Status: DC

## 2020-01-17 NOTE — ED Triage Notes (Signed)
Pt c/o two tick bites to inner left thigh two days ago. Pt states he removed the ticks and applied alcohol and now site/s are red and raised. Denies fever, joint pain, fatigue.

## 2020-01-18 NOTE — ED Provider Notes (Signed)
[facility]    CSN: [id number] Arrival date & time: 01/17/20  1435      History   Chief Complaint Chief Complaint  Patient presents with  . tick bite    HPI Marcus Richardson. is a 79 y.o. male comes to urgent care to be evaluated for erythema surrounding a tick bite.  Patient went hiking in the woods and noticed on engorged take on the left inner thigh.  He removed the tape and noticed that there is some erythematous around the tick bite area.  No generalized body aches.  No fever or chills.  HPI  Past Medical History:  Diagnosis Date  . Hyperlipidemia    no meds    There are no problems to display for this patient.   Past Surgical History:  Procedure Laterality Date  . BACK SURGERY  12/30/2016  . REPLACEMENT TOTAL HIP W/  RESURFACING IMPLANTS Right 2005  . SPINAL FUSION  12/30/2016       Home Medications    Prior to Admission medications   Medication Sig Start Date End Date Taking? Authorizing Provider  doxycycline (VIBRAMYCIN) 100 MG capsule Take 1 capsule (100 mg total) by mouth 2 (two) times daily. 01/17/20   Leontina Skidmore, Myrene Galas, MD    Family History Family History  Problem Relation Age of Onset  . Hyperlipidemia Mother   . Depression Sister   . Hypertension Brother   . Hyperlipidemia Brother   . Colon cancer Neg Hx   . Esophageal cancer Neg Hx   . Rectal cancer Neg Hx   . Stomach cancer Neg Hx     Social History Social History   Tobacco Use  . Smoking status: Never Smoker  . Smokeless tobacco: Never Used  Substance Use Topics  . Alcohol use: Yes    Alcohol/week: 7.0 standard drinks    Types: 7 Glasses of wine per week  . Drug use: No     Allergies   Tape   Review of Systems Review of Systems  Constitutional: Negative.   Respiratory: Negative.   Musculoskeletal: Negative.   Psychiatric/Behavioral: Negative.      Physical Exam Triage Vital Signs ED Triage Vitals  Enc Vitals Group     BP 01/17/20 1543 137/66      Pulse Rate 01/17/20 1543 (!) 46     Resp 01/17/20 1543 18     Temp 01/17/20 1541 98.1 F (36.7 C)     Temp Source 01/17/20 1541 Oral     SpO2 01/17/20 1543 99 %     Weight --      Height --      Head Circumference --      Peak Flow --      Pain Score 01/17/20 1541 0     Pain Loc --      Pain Edu? --      Excl. in [place]? --    No data found.  Updated Vital Signs BP 137/66 (BP Location: Right Arm)   Pulse (!) 46   Temp 98.1 F (36.7 C) (Oral)   Resp 18   SpO2 99%   Visual Acuity Right Eye Distance:   Left Eye Distance:   Bilateral Distance:    Right Eye Near:   Left Eye Near:    Bilateral Near:     Physical Exam Skin:    Capillary Refill: Capillary refill takes less than 2 seconds.     Comments: Indurated erythematous area on the left thigh measuring about  1 cm in the longest diameter.  Erythematous area is surrounded by a halo.  Small indurated area measuring about 0.5 cm also noted distal to the main erythematous area.      UC Treatments / Results  Labs (all labs ordered are listed, but only abnormal results are displayed) Labs Reviewed - No data to display  EKG   Radiology No results found.  Procedures Procedures (including critical care time)  Medications Ordered in UC Medications - No data to display  Initial Impression / Assessment and Plan / UC Course  I have reviewed the triage vital signs and the nursing notes.  Pertinent labs & imaging results that were available during my care of the patient were reviewed by me and considered in my medical decision making (see chart for details).     1.  Erythema following a tick bite: This is most likely secondary to irritation Doxycycline prophylaxis-100 mg twice daily for 10 days  Indication for antibody titer testing Return to urgent care if rash and erythematous area spreads. Final Clinical Impressions(s) / UC Diagnoses   Final diagnoses:  Tick bite of left thigh, initial encounter   Discharge  Instructions   None    ED Prescriptions    Medication Sig Dispense Auth. Provider   doxycycline (VIBRAMYCIN) 100 MG capsule Take 1 capsule (100 mg total) by mouth 2 (two) times daily. 20 capsule Altheria Shadoan, Myrene Galas, MD     PDMP not reviewed this encounter.   Chase Picket, MD 01/18/20 1318

## 2020-04-14 DIAGNOSIS — H2513 Age-related nuclear cataract, bilateral: Secondary | ICD-10-CM | POA: Diagnosis not present

## 2020-04-14 DIAGNOSIS — H0288A Meibomian gland dysfunction right eye, upper and lower eyelids: Secondary | ICD-10-CM | POA: Diagnosis not present

## 2020-04-14 DIAGNOSIS — H0288B Meibomian gland dysfunction left eye, upper and lower eyelids: Secondary | ICD-10-CM | POA: Diagnosis not present

## 2020-04-14 DIAGNOSIS — H40012 Open angle with borderline findings, low risk, left eye: Secondary | ICD-10-CM | POA: Diagnosis not present

## 2020-05-21 DIAGNOSIS — Z23 Encounter for immunization: Secondary | ICD-10-CM | POA: Diagnosis not present

## 2020-07-22 DIAGNOSIS — Z86018 Personal history of other benign neoplasm: Secondary | ICD-10-CM | POA: Diagnosis not present

## 2020-07-22 DIAGNOSIS — Z808 Family history of malignant neoplasm of other organs or systems: Secondary | ICD-10-CM | POA: Diagnosis not present

## 2020-07-22 DIAGNOSIS — D225 Melanocytic nevi of trunk: Secondary | ICD-10-CM | POA: Diagnosis not present

## 2020-07-22 DIAGNOSIS — L821 Other seborrheic keratosis: Secondary | ICD-10-CM | POA: Diagnosis not present

## 2020-07-22 DIAGNOSIS — L570 Actinic keratosis: Secondary | ICD-10-CM | POA: Diagnosis not present

## 2020-07-22 DIAGNOSIS — D2271 Melanocytic nevi of right lower limb, including hip: Secondary | ICD-10-CM | POA: Diagnosis not present

## 2020-07-22 DIAGNOSIS — D485 Neoplasm of uncertain behavior of skin: Secondary | ICD-10-CM | POA: Diagnosis not present

## 2020-07-22 DIAGNOSIS — L578 Other skin changes due to chronic exposure to nonionizing radiation: Secondary | ICD-10-CM | POA: Diagnosis not present

## 2020-08-25 DIAGNOSIS — M109 Gout, unspecified: Secondary | ICD-10-CM | POA: Diagnosis not present

## 2020-08-25 DIAGNOSIS — Z125 Encounter for screening for malignant neoplasm of prostate: Secondary | ICD-10-CM | POA: Diagnosis not present

## 2020-08-25 DIAGNOSIS — E785 Hyperlipidemia, unspecified: Secondary | ICD-10-CM | POA: Diagnosis not present

## 2020-08-25 DIAGNOSIS — E559 Vitamin D deficiency, unspecified: Secondary | ICD-10-CM | POA: Diagnosis not present

## 2020-08-31 DIAGNOSIS — R82998 Other abnormal findings in urine: Secondary | ICD-10-CM | POA: Diagnosis not present

## 2020-08-31 DIAGNOSIS — N183 Chronic kidney disease, stage 3 unspecified: Secondary | ICD-10-CM | POA: Diagnosis not present

## 2020-08-31 DIAGNOSIS — E559 Vitamin D deficiency, unspecified: Secondary | ICD-10-CM | POA: Diagnosis not present

## 2020-08-31 DIAGNOSIS — Z1212 Encounter for screening for malignant neoplasm of rectum: Secondary | ICD-10-CM | POA: Diagnosis not present

## 2020-08-31 DIAGNOSIS — Z1389 Encounter for screening for other disorder: Secondary | ICD-10-CM | POA: Diagnosis not present

## 2020-08-31 DIAGNOSIS — R7301 Impaired fasting glucose: Secondary | ICD-10-CM | POA: Diagnosis not present

## 2020-08-31 DIAGNOSIS — M8580 Other specified disorders of bone density and structure, unspecified site: Secondary | ICD-10-CM | POA: Diagnosis not present

## 2020-08-31 DIAGNOSIS — Z0000 Encounter for general adult medical examination without abnormal findings: Secondary | ICD-10-CM | POA: Diagnosis not present

## 2020-08-31 DIAGNOSIS — M109 Gout, unspecified: Secondary | ICD-10-CM | POA: Diagnosis not present

## 2020-08-31 DIAGNOSIS — E785 Hyperlipidemia, unspecified: Secondary | ICD-10-CM | POA: Diagnosis not present

## 2020-11-19 DIAGNOSIS — Z23 Encounter for immunization: Secondary | ICD-10-CM | POA: Diagnosis not present

## 2021-05-12 ENCOUNTER — Ambulatory Visit
Admission: RE | Admit: 2021-05-12 | Discharge: 2021-05-12 | Disposition: A | Discharge status: Home / Self Care | Payer: Medicare Other | Source: Ambulatory Visit | Attending: Orthopedic Surgery | Admitting: Orthopedic Surgery

## 2021-05-12 ENCOUNTER — Other Ambulatory Visit: Payer: Self-pay | Admitting: Orthopedic Surgery

## 2021-05-12 ENCOUNTER — Other Ambulatory Visit: Payer: Self-pay

## 2021-05-12 DIAGNOSIS — M25559 Pain in unspecified hip: Secondary | ICD-10-CM

## 2021-05-12 DIAGNOSIS — M25551 Pain in right hip: Secondary | ICD-10-CM | POA: Diagnosis not present

## 2021-05-12 DIAGNOSIS — Z23 Encounter for immunization: Secondary | ICD-10-CM | POA: Diagnosis not present

## 2021-07-22 DIAGNOSIS — D2271 Melanocytic nevi of right lower limb, including hip: Secondary | ICD-10-CM | POA: Diagnosis not present

## 2021-07-22 DIAGNOSIS — D225 Melanocytic nevi of trunk: Secondary | ICD-10-CM | POA: Diagnosis not present

## 2021-07-22 DIAGNOSIS — L821 Other seborrheic keratosis: Secondary | ICD-10-CM | POA: Diagnosis not present

## 2021-07-22 DIAGNOSIS — Z808 Family history of malignant neoplasm of other organs or systems: Secondary | ICD-10-CM | POA: Diagnosis not present

## 2021-07-22 DIAGNOSIS — L578 Other skin changes due to chronic exposure to nonionizing radiation: Secondary | ICD-10-CM | POA: Diagnosis not present

## 2021-07-22 DIAGNOSIS — L814 Other melanin hyperpigmentation: Secondary | ICD-10-CM | POA: Diagnosis not present

## 2021-07-22 DIAGNOSIS — Z86018 Personal history of other benign neoplasm: Secondary | ICD-10-CM | POA: Diagnosis not present

## 2021-09-20 DIAGNOSIS — E559 Vitamin D deficiency, unspecified: Secondary | ICD-10-CM | POA: Diagnosis not present

## 2021-09-20 DIAGNOSIS — I700 Atherosclerosis of aorta: Secondary | ICD-10-CM | POA: Diagnosis not present

## 2021-09-20 DIAGNOSIS — Z125 Encounter for screening for malignant neoplasm of prostate: Secondary | ICD-10-CM | POA: Diagnosis not present

## 2021-09-20 DIAGNOSIS — M109 Gout, unspecified: Secondary | ICD-10-CM | POA: Diagnosis not present

## 2021-09-20 DIAGNOSIS — E785 Hyperlipidemia, unspecified: Secondary | ICD-10-CM | POA: Diagnosis not present

## 2021-10-07 ENCOUNTER — Other Ambulatory Visit: Payer: Self-pay | Admitting: Internal Medicine

## 2021-10-07 DIAGNOSIS — Z1331 Encounter for screening for depression: Secondary | ICD-10-CM | POA: Diagnosis not present

## 2021-10-07 DIAGNOSIS — E785 Hyperlipidemia, unspecified: Secondary | ICD-10-CM | POA: Diagnosis not present

## 2021-10-07 DIAGNOSIS — Z1339 Encounter for screening examination for other mental health and behavioral disorders: Secondary | ICD-10-CM | POA: Diagnosis not present

## 2021-10-07 DIAGNOSIS — Z0000 Encounter for general adult medical examination without abnormal findings: Secondary | ICD-10-CM | POA: Diagnosis not present

## 2021-10-07 DIAGNOSIS — E559 Vitamin D deficiency, unspecified: Secondary | ICD-10-CM | POA: Diagnosis not present

## 2021-10-07 DIAGNOSIS — M8580 Other specified disorders of bone density and structure, unspecified site: Secondary | ICD-10-CM | POA: Diagnosis not present

## 2021-10-07 DIAGNOSIS — M109 Gout, unspecified: Secondary | ICD-10-CM | POA: Diagnosis not present

## 2021-11-04 ENCOUNTER — Ambulatory Visit
Admission: RE | Admit: 2021-11-04 | Discharge: 2021-11-04 | Disposition: A | Discharge status: Home / Self Care | Payer: No Typology Code available for payment source | Source: Ambulatory Visit | Attending: Internal Medicine | Admitting: Internal Medicine

## 2021-11-04 DIAGNOSIS — E785 Hyperlipidemia, unspecified: Secondary | ICD-10-CM

## 2021-11-15 DIAGNOSIS — M8589 Other specified disorders of bone density and structure, multiple sites: Secondary | ICD-10-CM | POA: Diagnosis not present

## 2022-03-25 DIAGNOSIS — Z23 Encounter for immunization: Secondary | ICD-10-CM | POA: Diagnosis not present

## 2022-03-28 ENCOUNTER — Ambulatory Visit (HOSPITAL_COMMUNITY)
Admission: EM | Admit: 2022-03-28 | Discharge: 2022-03-28 | Disposition: A | Discharge status: Home / Self Care | Payer: Medicare Other | Attending: Family Medicine | Admitting: Family Medicine

## 2022-03-28 ENCOUNTER — Encounter (HOSPITAL_COMMUNITY): Payer: Self-pay

## 2022-03-28 DIAGNOSIS — H938X2 Other specified disorders of left ear: Secondary | ICD-10-CM

## 2022-03-28 DIAGNOSIS — H6123 Impacted cerumen, bilateral: Secondary | ICD-10-CM

## 2022-03-28 DIAGNOSIS — H6120 Impacted cerumen, unspecified ear: Secondary | ICD-10-CM

## 2022-03-28 MED ORDER — CARBAMIDE PEROXIDE 6.5 % OT SOLN: 5.0000 [drp] | OTIC | 0 refills | Status: DC

## 2022-03-28 NOTE — ED Provider Notes (Signed)
[facility]    CSN: [id number] Arrival date & time: 03/28/22  1458      History   Chief Complaint Ear fullness   HPI Marcus Richardson. is a 81 y.o. male.  Presents today history of left ear fullness.  Denies any pain or drainage.  A little bit muffled hearing. Has history of wax impaction for which he has required rinse out.  Reports this feels similar. No fever.  No history of diabetes.  No recent illness or congestion.  Past Medical History:  Diagnosis Date   Hyperlipidemia    no meds    There are no problems to display for this patient.   Past Surgical History:  Procedure Laterality Date   BACK SURGERY  12/30/2016   REPLACEMENT TOTAL HIP W/  RESURFACING IMPLANTS Right 2005   SPINAL FUSION  12/30/2016       Home Medications    Prior to Admission medications   Medication Sig Start Date End Date Taking? Authorizing Provider  carbamide peroxide (DEBROX) 6.5 % OTIC solution Place 5 drops into both ears 2 (two) times a week. 03/28/22  Yes Lashawnna Lambrecht, Wells Guiles, PA-C  doxycycline (VIBRAMYCIN) 100 MG capsule Take 1 capsule (100 mg total) by mouth 2 (two) times daily. 01/17/20   Lamptey, Myrene Galas, MD    Family History Family History  Problem Relation Age of Onset   Hyperlipidemia Mother    Depression Sister    Hypertension Brother    Hyperlipidemia Brother    Colon cancer Neg Hx    Esophageal cancer Neg Hx    Rectal cancer Neg Hx    Stomach cancer Neg Hx     Social History Social History   Tobacco Use   Smoking status: Never   Smokeless tobacco: Never  Substance Use Topics   Alcohol use: Yes    Alcohol/week: 7.0 standard drinks of alcohol    Types: 7 Glasses of wine per week   Drug use: No     Allergies   Tape   Review of Systems Review of Systems Per HPI  Physical Exam Triage Vital Signs ED Triage Vitals [03/28/22 1516]  Enc Vitals Group     BP 124/79     Pulse Rate 82     Resp 18     Temp 98 F (36.7 C)     Temp Source Oral      SpO2 99 %     Weight      Height      Head Circumference      Peak Flow      Pain Score      Pain Loc      Pain Edu?      Excl. in [place]?    No data found.  Updated Vital Signs BP 124/79 (BP Location: Left Arm)   Pulse 82   Temp 98 F (36.7 C) (Oral)   Resp 18   SpO2 99%    Physical Exam Vitals and nursing note reviewed.  Constitutional:      General: He is not in acute distress. HENT:     Right Ear: External ear normal.     Left Ear: External ear normal.     Ears:     Comments: Bilateral ears have small, hard clump of wax sitting at the entrance to the ear canal.  Tried to remove with curette but patient had pain    Mouth/Throat:     Pharynx: Oropharynx is clear.  Eyes:  Conjunctiva/sclera: Conjunctivae normal.  Cardiovascular:     Rate and Rhythm: Normal rate and regular rhythm.     Pulses: Normal pulses.     Heart sounds: Normal heart sounds.  Pulmonary:     Effort: Pulmonary effort is normal.     Breath sounds: Normal breath sounds.  Neurological:     Mental Status: He is alert and oriented to person, place, and time.     UC Treatments / Results  Labs (all labs ordered are listed, but only abnormal results are displayed) Labs Reviewed - No data to display  EKG  Radiology No results found.  Procedures Procedures  Medications Ordered in UC Medications - No data to display  Initial Impression / Assessment and Plan / UC Course  I have reviewed the triage vital signs and the nursing notes.  Pertinent labs & imaging results that were available during my care of the patient were reviewed by me and considered in my medical decision making (see chart for details).  Unable to remove wax with curette, rinse-out performed and patient reports improvement in his symptoms.  Mild irritation of bilateral canals but patient denies pain. Discussed use of debrox drops twice weekly for prevention of wax buildup. Return precautions discussed. Patient agrees to  plan  Final Clinical Impressions(s) / UC Diagnoses   Final diagnoses:  Ear fullness, left  Wax in ear     Discharge Instructions      I recommend using Debrox eardrops twice weekly to help prevent wax buildup.  I have sent these into your pharmacy, but you can also get them over-the-counter.     ED Prescriptions     Medication Sig Dispense Auth. Provider   carbamide peroxide (DEBROX) 6.5 % OTIC solution Place 5 drops into both ears 2 (two) times a week. 15 mL Calub Tarnow, Wells Guiles, PA-C      PDMP not reviewed this encounter.   Porschea Borys, Vernice Jefferson 03/28/22 1625

## 2022-03-28 NOTE — ED Triage Notes (Signed)
Pt reports left ear fullness x 2-3 days.

## 2022-03-28 NOTE — Discharge Instructions (Addendum)
I recommend using Debrox eardrops twice weekly to help prevent wax buildup.  I have sent these into your pharmacy, but you can also get them over-the-counter.

## 2022-04-13 DIAGNOSIS — S81819A Laceration without foreign body, unspecified lower leg, initial encounter: Secondary | ICD-10-CM

## 2022-04-13 HISTORY — DX: Laceration without foreign body, unspecified lower leg, initial encounter: S81.819A

## 2022-04-16 ENCOUNTER — Other Ambulatory Visit: Payer: Self-pay

## 2022-04-16 ENCOUNTER — Ambulatory Visit (HOSPITAL_COMMUNITY)
Admission: EM | Admit: 2022-04-16 | Discharge: 2022-04-16 | Disposition: A | Discharge status: Home / Self Care | Payer: Medicare Other

## 2022-04-16 ENCOUNTER — Encounter (HOSPITAL_COMMUNITY): Payer: Self-pay

## 2022-04-16 ENCOUNTER — Emergency Department (HOSPITAL_BASED_OUTPATIENT_CLINIC_OR_DEPARTMENT_OTHER): Payer: Medicare Other

## 2022-04-16 ENCOUNTER — Encounter (HOSPITAL_BASED_OUTPATIENT_CLINIC_OR_DEPARTMENT_OTHER): Payer: Self-pay

## 2022-04-16 ENCOUNTER — Emergency Department (HOSPITAL_BASED_OUTPATIENT_CLINIC_OR_DEPARTMENT_OTHER)
Admission: EM | Admit: 2022-04-16 | Discharge: 2022-04-16 | Disposition: A | Discharge status: Home / Self Care | Payer: Medicare Other | Attending: Emergency Medicine | Admitting: Emergency Medicine

## 2022-04-16 DIAGNOSIS — W1781XA Fall down embankment (hill), initial encounter: Secondary | ICD-10-CM

## 2022-04-16 DIAGNOSIS — M7989 Other specified soft tissue disorders: Secondary | ICD-10-CM | POA: Diagnosis not present

## 2022-04-16 DIAGNOSIS — S81811A Laceration without foreign body, right lower leg, initial encounter: Secondary | ICD-10-CM | POA: Diagnosis not present

## 2022-04-16 DIAGNOSIS — L089 Local infection of the skin and subcutaneous tissue, unspecified: Secondary | ICD-10-CM | POA: Diagnosis not present

## 2022-04-16 DIAGNOSIS — Y9301 Activity, walking, marching and hiking: Secondary | ICD-10-CM

## 2022-04-16 DIAGNOSIS — M79604 Pain in right leg: Secondary | ICD-10-CM | POA: Diagnosis present

## 2022-04-16 DIAGNOSIS — R600 Localized edema: Secondary | ICD-10-CM

## 2022-04-16 DIAGNOSIS — Z9181 History of falling: Secondary | ICD-10-CM

## 2022-04-16 MED ORDER — DOXYCYCLINE HYCLATE 100 MG PO TABS
100.0000 mg | ORAL_TABLET | Freq: Once | ORAL | Status: AC
  Administered 2022-04-16: 100 mg via ORAL
  Filled 2022-04-16: qty 1

## 2022-04-16 MED ORDER — DOXYCYCLINE HYCLATE 100 MG PO CAPS: 100.0000 mg | ORAL_CAPSULE | Freq: Two times a day (BID) | ORAL | 0 refills | Status: AC

## 2022-04-16 NOTE — ED Notes (Signed)
Patient is being discharged from the Urgent Care and sent to the Emergency Department via private vehicle . Per A. A Allwardt PA, patient is in need of higher level of care due to wound evaluation. Patient is aware and verbalizes understanding of plan of care.  Vitals:   04/16/22 1702  BP: 123/77  Pulse: (!) 56  Resp: 16  Temp: 99.2 F (37.3 C)  SpO2: 96%

## 2022-04-16 NOTE — ED Provider Notes (Signed)
   [name]  Provider Note  CSN: [id number] Arrival date & time: 04/16/22 1804  History Chief Complaint  Patient presents with   Leg Pain    Marcus Richardson. is a 81 y.o. male reports he fell [place] ago sustaining abrasions and laceration to scalp and R leg. Since returning he has had increasing swelling and redness to R calf. He is not on any Abx. He went to [facility] and sent to the ED to rule out DVT. Denies fever.    Home Medications Prior to Admission medications   Medication Sig Start Date End Date Taking? Authorizing Provider  carbamide peroxide (DEBROX) 6.5 % OTIC solution Place 5 drops into both ears 2 (two) times a week. 03/28/22   Rising, Wells Guiles, PA-C  doxycycline (VIBRAMYCIN) 100 MG capsule Take 1 capsule (100 mg total) by mouth 2 (two) times daily for 7 days. 04/16/22 04/23/22  Truddie Hidden, MD     Allergies    Tape   Review of Systems   Review of Systems Please see HPI for pertinent positives and negatives  Physical Exam BP 124/69   Pulse (!) 49   Temp 98.7 F (37.1 C) (Oral)   Resp 18   Ht '5\' 8"'$  (1.727 m)   Wt 79.4 kg   SpO2 99%   BMI 26.61 kg/m   Physical Exam Vitals and nursing note reviewed.  HENT:     Head: Normocephalic.     Nose: Nose normal.  Eyes:     Extraocular Movements: Extraocular movements intact.  Pulmonary:     Effort: Pulmonary effort is normal.  Musculoskeletal:        General: Normal range of motion.     Cervical back: Neck supple.     Comments: Erythema, warmth and induration to R lower leg, there is a small amount of purulent drainage from laceration on R anterior shin.   Skin:    Findings: No rash (on exposed skin).  Neurological:     Mental Status: He is alert and oriented to person, place, and time.  Psychiatric:        Mood and Affect: Mood normal.     ED Results / Procedures / Treatments   EKG None  Procedures Procedures  Medications Ordered in the  ED Medications  doxycycline (VIBRA-TABS) tablet 100 mg (100 mg Oral Given 04/16/22 2322)    Initial Impression and Plan  Patient's DVT study is negative for clot. I took down on suture to promote drainage of his wound infection and expressed a moderate amount of pus. Will start doxycycline. Given wound care instructions and advised to follow up with PCP in 2 days for recheck.   ED Course       MDM Rules/Calculators/A&P Medical Decision Making Problems Addressed: Wound infection: acute illness or injury  Amount and/or Complexity of Data Reviewed ECG/medicine tests: ordered and independent interpretation performed. Decision-making details documented in ED Course.  Risk Prescription drug management.    Final Clinical Impression(s) / ED Diagnoses Final diagnoses:  Wound infection    Rx / DC Orders ED Discharge Orders          Ordered    doxycycline (VIBRAMYCIN) 100 MG capsule  2 times daily        04/16/22 2318             Truddie Hidden, MD 04/16/22 [phone]

## 2022-04-16 NOTE — ED Triage Notes (Signed)
Pt states he fell while hiking on 04/13/22 and has a laceration to his right lower leg and skin tears to his left hand. States yesterday he noticed redness and swelling to both areas.

## 2022-04-16 NOTE — Discharge Instructions (Signed)
Good to meet you today. I am worried that you might have an acute blood clot in your right lower leg. Please present to [facility] health emergency department at [facility], where they can perform an utrasound of your leg.  Address: [address] [place], [place], [place] [address] Phone: [phone] Suggest an edit

## 2022-04-16 NOTE — ED Provider Notes (Signed)
[facility]   MRN: [id number] DOB: 30-Oct-1940  Subjective:   Marcus Richardson. is a 81 y.o. male presenting for concern for infection of right lower leg.  Patient states that he was hiking [place] earlier this week, when he accidentally fell on 04/13/2022, suffering numerous abrasions and a deep laceration to his right anterior leg.  He states that he had to be [place]ed [place] the [place] and taken to a nearby facility to be treated.  He was stitched up, but not placed on any antibiotic prophylaxis.  He denies any loss of consciousness during this fall.  He had an 8-hour flight back to [place].  Reports that today he has noticed redness and swelling to his right lower extremity.  Denies any chest pain or shortness of breath.  Denies any pain.  No current facility-administered medications for this encounter.  Current Outpatient Medications:    carbamide peroxide (DEBROX) 6.5 % OTIC solution, Place 5 drops into both ears 2 (two) times a week., Disp: 15 mL, Rfl: 0   doxycycline (VIBRAMYCIN) 100 MG capsule, Take 1 capsule (100 mg total) by mouth 2 (two) times daily., Disp: 20 capsule, Rfl: 0   Allergies  Allergen Reactions   Tape Rash    Past Medical History:  Diagnosis Date   Hyperlipidemia    no meds     Past Surgical History:  Procedure Laterality Date   BACK SURGERY  12/30/2016   REPLACEMENT TOTAL HIP W/  RESURFACING IMPLANTS Right 2005   SPINAL FUSION  12/30/2016    Family History  Problem Relation Age of Onset   Hyperlipidemia Mother    Depression Sister    Hypertension Brother    Hyperlipidemia Brother    Colon cancer Neg Hx    Esophageal cancer Neg Hx    Rectal cancer Neg Hx    Stomach cancer Neg Hx     Social History   Tobacco Use   Smoking status: Never   Smokeless tobacco: Never  Substance Use Topics   Alcohol use: Yes    Alcohol/week: 7.0 standard drinks of alcohol    Types: 7 Glasses of wine per week   Drug use: No     ROS REFER TO HPI FOR PERTINENT POSITIVES AND NEGATIVES   Objective:   Vitals: BP 123/77 (BP Location: Right Arm)   Pulse (!) 56   Temp 99.2 F (37.3 C) (Oral)   Resp 16   SpO2 96%   Physical Exam Vitals and nursing note reviewed.  Constitutional:      General: He is not in acute distress.    Appearance: He is well-developed.  HENT:     Head: Normocephalic and atraumatic.  Eyes:     Conjunctiva/sclera: Conjunctivae normal.  Cardiovascular:     Rate and Rhythm: Normal rate and regular rhythm.     Heart sounds: No murmur heard. Pulmonary:     Effort: Pulmonary effort is normal. No respiratory distress.     Breath sounds: Normal breath sounds.  Musculoskeletal:     Cervical back: Neck supple.     Right lower leg: Edema (3+ RLE pitting edema inferior to knee and superior to ankle; erythematous) present.  Skin:    General: Skin is warm and dry.     Capillary Refill: Capillary refill takes less than 2 seconds.     Comments: 3 laceration repair as noted on the scalp.  Superficial abrasion to left hand and right hand noted.  Sutured laceration  repair noted to right shin.  Neurological:     Mental Status: He is alert.  Psychiatric:        Mood and Affect: Mood normal.     No results found for this or any previous visit (from the past 24 hour(s)).  Assessment and Plan :   PDMP not reviewed this encounter.  1. Edema of right lower leg   2. Laceration of right lower leg, initial encounter   3. History of recent fall    Discussed with patient given his recent fall, injury, and long flight, I am concerned that he might have a DVT of the right lower extremity.  I strongly encouraged that he go to the emergency department for Doppler imaging of this right lower leg.  Should this be negative for DVT, most likely this could be treated as infectious etiology.  Defer to ED for further work-up and treatment.  Patient agreeable and understanding of plan.  Stable for discharge and  will present to the ED by personal vehicle.    AllwardtRanda Evens, PA-C 04/16/22 1750

## 2022-04-16 NOTE — ED Triage Notes (Signed)
Pt sent from UC for r/o of  clot in right lower leg. Leg is reddened and swollen. Pt had recent travel from [place] last night.

## 2022-04-22 DIAGNOSIS — S41112A Laceration without foreign body of left upper arm, initial encounter: Secondary | ICD-10-CM | POA: Diagnosis not present

## 2022-04-22 DIAGNOSIS — T148XXA Other injury of unspecified body region, initial encounter: Secondary | ICD-10-CM | POA: Diagnosis not present

## 2022-04-22 DIAGNOSIS — S81811A Laceration without foreign body, right lower leg, initial encounter: Secondary | ICD-10-CM | POA: Diagnosis not present

## 2022-04-22 DIAGNOSIS — Z4802 Encounter for removal of sutures: Secondary | ICD-10-CM | POA: Diagnosis not present

## 2022-04-22 DIAGNOSIS — S0101XA Laceration without foreign body of scalp, initial encounter: Secondary | ICD-10-CM | POA: Diagnosis not present

## 2022-04-22 DIAGNOSIS — L089 Local infection of the skin and subcutaneous tissue, unspecified: Secondary | ICD-10-CM | POA: Diagnosis not present

## 2022-05-03 DIAGNOSIS — Z4802 Encounter for removal of sutures: Secondary | ICD-10-CM | POA: Diagnosis not present

## 2022-05-03 DIAGNOSIS — S0101XD Laceration without foreign body of scalp, subsequent encounter: Secondary | ICD-10-CM | POA: Diagnosis not present

## 2022-05-31 DIAGNOSIS — Z23 Encounter for immunization: Secondary | ICD-10-CM | POA: Diagnosis not present

## 2022-06-13 DIAGNOSIS — M25561 Pain in right knee: Secondary | ICD-10-CM | POA: Diagnosis not present

## 2022-06-27 ENCOUNTER — Ambulatory Visit: Payer: Self-pay | Admitting: Surgery

## 2022-06-27 DIAGNOSIS — K4020 Bilateral inguinal hernia, without obstruction or gangrene, not specified as recurrent: Secondary | ICD-10-CM | POA: Diagnosis not present

## 2022-08-04 ENCOUNTER — Encounter (HOSPITAL_BASED_OUTPATIENT_CLINIC_OR_DEPARTMENT_OTHER): Payer: Self-pay | Admitting: Surgery

## 2022-08-05 ENCOUNTER — Other Ambulatory Visit: Payer: Self-pay

## 2022-08-05 ENCOUNTER — Encounter (HOSPITAL_BASED_OUTPATIENT_CLINIC_OR_DEPARTMENT_OTHER): Payer: Self-pay | Admitting: Surgery

## 2022-08-05 NOTE — Progress Notes (Signed)
Spoke w/ via phone for pre-op interview---Marcus Richardson needs dos---- none              Richardson results------11/04/21 CT cardiac scoring COVID test -----patient states asymptomatic no test needed Arrive at -------0530 on Tuesday, 08/09/2022 NPO after MN NO Solid Food.  Water only from MN until---0430 Med rec completed Medications to take morning of surgery -----Colchicine Diabetic medication -----n/a Patient instructed no nail polish to be worn day of surgery Patient instructed to bring photo id and insurance card day of surgery Patient aware to have Driver (ride ) / caregiver    for 24 hours after surgery - wife, Marcus Richardson Patient Special Instructions -----none Pre-Op special Istructions -----none Patient verbalized understanding of instructions that were given at this phone interview. Patient denies shortness of breath, chest pain, fever, cough at this phone interview.  A pre-op Ensure drink was ordered by the surgeon. Patient was informed, but said he did not need it.

## 2022-08-08 NOTE — Anesthesia Preprocedure Evaluation (Signed)
Anesthesia Evaluation  Patient identified by MRN, date of birth, ID band Patient awake    Reviewed: Allergy & Precautions, NPO status , Patient's Chart, lab work & pertinent test results  History of Anesthesia Complications Negative for: history of anesthetic complications  Airway Mallampati: II  TM Distance: >3 FB Neck ROM: Full    Dental  (+) Dental Advisory Given, Chipped   Pulmonary neg pulmonary ROS   Pulmonary exam normal        Cardiovascular negative cardio ROS Normal cardiovascular exam     Neuro/Psych  Neuromuscular disease (sciatica)  negative psych ROS   GI/Hepatic negative GI ROS, Neg liver ROS,,,  Endo/Other  negative endocrine ROS    Renal/GU negative Renal ROS     Musculoskeletal  (+) Arthritis , Osteoarthritis,    Abdominal   Peds  Hematology negative hematology ROS (+)   Anesthesia Other Findings   Reproductive/Obstetrics                             Anesthesia Physical Anesthesia Plan  ASA: 2  Anesthesia Plan: General   Post-op Pain Management: Tylenol PO (pre-op)*   Induction: Intravenous  PONV Risk Score and Plan: 2 and Treatment may vary due to age or medical condition, Ondansetron and Dexamethasone  Airway Management Planned: Oral ETT  Additional Equipment: None  Intra-op Plan:   Post-operative Plan: Extubation in OR  Informed Consent: I have reviewed the patients History and Physical, chart, labs and discussed the procedure including the risks, benefits and alternatives for the proposed anesthesia with the patient or authorized representative who has indicated his/her understanding and acceptance.     Dental advisory given  Plan Discussed with: CRNA and Anesthesiologist  Anesthesia Plan Comments:        Anesthesia Quick Evaluation

## 2022-08-09 ENCOUNTER — Ambulatory Visit (HOSPITAL_BASED_OUTPATIENT_CLINIC_OR_DEPARTMENT_OTHER): Payer: Medicare Other | Admitting: Anesthesiology

## 2022-08-09 ENCOUNTER — Other Ambulatory Visit: Payer: Self-pay

## 2022-08-09 ENCOUNTER — Encounter (HOSPITAL_BASED_OUTPATIENT_CLINIC_OR_DEPARTMENT_OTHER): Payer: Self-pay | Admitting: Surgery

## 2022-08-09 ENCOUNTER — Ambulatory Visit (HOSPITAL_BASED_OUTPATIENT_CLINIC_OR_DEPARTMENT_OTHER)
Admission: RE | Admit: 2022-08-09 | Discharge: 2022-08-09 | Disposition: A | Discharge status: Home / Self Care | Payer: Medicare Other | Attending: Surgery | Admitting: Surgery

## 2022-08-09 ENCOUNTER — Encounter (HOSPITAL_BASED_OUTPATIENT_CLINIC_OR_DEPARTMENT_OTHER)
Admission: RE | Disposition: A | Discharge status: Home / Self Care | Payer: Self-pay | Source: Home / Self Care | Attending: Surgery

## 2022-08-09 DIAGNOSIS — M1990 Unspecified osteoarthritis, unspecified site: Secondary | ICD-10-CM | POA: Insufficient documentation

## 2022-08-09 DIAGNOSIS — K4020 Bilateral inguinal hernia, without obstruction or gangrene, not specified as recurrent: Secondary | ICD-10-CM

## 2022-08-09 DIAGNOSIS — Z01818 Encounter for other preprocedural examination: Secondary | ICD-10-CM

## 2022-08-09 HISTORY — DX: Spondylolysis, site unspecified: M43.00

## 2022-08-09 HISTORY — PX: INGUINAL HERNIA REPAIR: SHX194

## 2022-08-09 HISTORY — DX: Unspecified osteoarthritis, unspecified site: M19.90

## 2022-08-09 HISTORY — DX: Other cervical disc degeneration, unspecified cervical region: M50.30

## 2022-08-09 HISTORY — DX: Vitamin D deficiency, unspecified: E55.9

## 2022-08-09 HISTORY — DX: Personal history of urinary calculi: Z87.442

## 2022-08-09 HISTORY — DX: Other intervertebral disc degeneration, lumbosacral region: M51.37

## 2022-08-09 HISTORY — DX: Sciatica, right side: M54.31

## 2022-08-09 HISTORY — DX: Unilateral inguinal hernia, without obstruction or gangrene, not specified as recurrent: K40.90

## 2022-08-09 SURGERY — REPAIR, HERNIA, INGUINAL, LAPAROSCOPIC
Anesthesia: General | Laterality: Bilateral

## 2022-08-09 MED ORDER — ACETAMINOPHEN 500 MG PO TABS
ORAL_TABLET | ORAL | Status: AC
  Filled 2022-08-09: qty 2

## 2022-08-09 MED ORDER — GABAPENTIN 300 MG PO CAPS
300.0000 mg | ORAL_CAPSULE | ORAL | Status: AC
  Administered 2022-08-09: 300 mg via ORAL

## 2022-08-09 MED ORDER — GLYCOPYRROLATE PF 0.2 MG/ML IJ SOSY
PREFILLED_SYRINGE | INTRAMUSCULAR | Status: AC
  Filled 2022-08-09: qty 1

## 2022-08-09 MED ORDER — SUGAMMADEX SODIUM 200 MG/2ML IV SOLN
INTRAVENOUS | Status: DC | PRN
  Administered 2022-08-09: 200 mg via INTRAVENOUS

## 2022-08-09 MED ORDER — CEFAZOLIN SODIUM-DEXTROSE 2-4 GM/100ML-% IV SOLN
INTRAVENOUS | Status: AC
  Filled 2022-08-09: qty 100

## 2022-08-09 MED ORDER — EPHEDRINE SULFATE-NACL 50-0.9 MG/10ML-% IV SOSY
PREFILLED_SYRINGE | INTRAVENOUS | Status: DC | PRN
  Administered 2022-08-09 (×2): 5 mg via INTRAVENOUS

## 2022-08-09 MED ORDER — EPHEDRINE 5 MG/ML INJ
INTRAVENOUS | Status: AC
  Filled 2022-08-09: qty 5

## 2022-08-09 MED ORDER — DEXAMETHASONE SODIUM PHOSPHATE 10 MG/ML IJ SOLN
INTRAMUSCULAR | Status: DC | PRN
  Administered 2022-08-09: 5 mg via INTRAVENOUS

## 2022-08-09 MED ORDER — ONDANSETRON HCL 4 MG/2ML IJ SOLN
INTRAMUSCULAR | Status: AC
  Filled 2022-08-09: qty 2

## 2022-08-09 MED ORDER — FENTANYL CITRATE (PF) 100 MCG/2ML IJ SOLN
INTRAMUSCULAR | Status: DC | PRN
  Administered 2022-08-09 (×2): 50 ug via INTRAVENOUS

## 2022-08-09 MED ORDER — PHENYLEPHRINE 80 MCG/ML (10ML) SYRINGE FOR IV PUSH (FOR BLOOD PRESSURE SUPPORT)
PREFILLED_SYRINGE | INTRAVENOUS | Status: AC
  Filled 2022-08-09: qty 20

## 2022-08-09 MED ORDER — LIDOCAINE HCL (PF) 2 % IJ SOLN
INTRAMUSCULAR | Status: AC
  Filled 2022-08-09: qty 5

## 2022-08-09 MED ORDER — TRAMADOL HCL 50 MG PO TABS: 50.0000 mg | ORAL_TABLET | Freq: Four times a day (QID) | ORAL | 0 refills | Status: AC | PRN

## 2022-08-09 MED ORDER — ONDANSETRON HCL 4 MG/2ML IJ SOLN
INTRAMUSCULAR | Status: DC | PRN
  Administered 2022-08-09: 4 mg via INTRAVENOUS

## 2022-08-09 MED ORDER — PHENYLEPHRINE HCL-NACL 20-0.9 MG/250ML-% IV SOLN
INTRAVENOUS | Status: DC | PRN
  Administered 2022-08-09: 40 ug/min via INTRAVENOUS

## 2022-08-09 MED ORDER — LIDOCAINE 2% (20 MG/ML) 5 ML SYRINGE
INTRAMUSCULAR | Status: DC | PRN
  Administered 2022-08-09: 60 mg via INTRAVENOUS

## 2022-08-09 MED ORDER — BUPIVACAINE LIPOSOME 1.3 % IJ SUSP: 20.0000 mL | Freq: Once | INTRAMUSCULAR | Status: DC

## 2022-08-09 MED ORDER — FENTANYL CITRATE (PF) 100 MCG/2ML IJ SOLN: 25.0000 ug | INTRAMUSCULAR | Status: DC | PRN

## 2022-08-09 MED ORDER — OXYCODONE HCL 5 MG/5ML PO SOLN: 5.0000 mg | Freq: Once | ORAL | Status: DC | PRN

## 2022-08-09 MED ORDER — PHENYLEPHRINE 80 MCG/ML (10ML) SYRINGE FOR IV PUSH (FOR BLOOD PRESSURE SUPPORT)
PREFILLED_SYRINGE | INTRAVENOUS | Status: DC | PRN
  Administered 2022-08-09 (×2): 160 ug via INTRAVENOUS
  Administered 2022-08-09: 80 ug via INTRAVENOUS
  Administered 2022-08-09 (×2): 160 ug via INTRAVENOUS

## 2022-08-09 MED ORDER — ARTIFICIAL TEARS OPHTHALMIC OINT
TOPICAL_OINTMENT | OPHTHALMIC | Status: AC
  Filled 2022-08-09: qty 3.5

## 2022-08-09 MED ORDER — GLYCOPYRROLATE PF 0.2 MG/ML IJ SOSY
PREFILLED_SYRINGE | INTRAMUSCULAR | Status: DC | PRN
  Administered 2022-08-09: .2 mg via INTRAVENOUS

## 2022-08-09 MED ORDER — PROPOFOL 10 MG/ML IV BOLUS
INTRAVENOUS | Status: AC
  Filled 2022-08-09: qty 20

## 2022-08-09 MED ORDER — PROPOFOL 10 MG/ML IV BOLUS
INTRAVENOUS | Status: DC | PRN
  Administered 2022-08-09: 130 mg via INTRAVENOUS

## 2022-08-09 MED ORDER — ROCURONIUM BROMIDE 10 MG/ML (PF) SYRINGE
PREFILLED_SYRINGE | INTRAVENOUS | Status: AC
  Filled 2022-08-09: qty 10

## 2022-08-09 MED ORDER — GABAPENTIN 300 MG PO CAPS
ORAL_CAPSULE | ORAL | Status: AC
  Filled 2022-08-09: qty 1

## 2022-08-09 MED ORDER — ACETAMINOPHEN 500 MG PO TABS
1000.0000 mg | ORAL_TABLET | ORAL | Status: AC
  Administered 2022-08-09: 1000 mg via ORAL

## 2022-08-09 MED ORDER — OXYCODONE HCL 5 MG PO TABS: 5.0000 mg | ORAL_TABLET | Freq: Once | ORAL | Status: DC | PRN

## 2022-08-09 MED ORDER — ROCURONIUM BROMIDE 10 MG/ML (PF) SYRINGE
PREFILLED_SYRINGE | INTRAVENOUS | Status: DC | PRN
  Administered 2022-08-09: 50 mg via INTRAVENOUS
  Administered 2022-08-09: 10 mg via INTRAVENOUS

## 2022-08-09 MED ORDER — CHLORHEXIDINE GLUCONATE CLOTH 2 % EX PADS: 6.0000 | MEDICATED_PAD | Freq: Once | CUTANEOUS | Status: DC

## 2022-08-09 MED ORDER — ONDANSETRON HCL 4 MG/2ML IJ SOLN: 4.0000 mg | Freq: Once | INTRAMUSCULAR | Status: DC | PRN

## 2022-08-09 MED ORDER — FENTANYL CITRATE (PF) 250 MCG/5ML IJ SOLN
INTRAMUSCULAR | Status: AC
  Filled 2022-08-09: qty 5

## 2022-08-09 MED ORDER — ENSURE PRE-SURGERY PO LIQD: 296.0000 mL | Freq: Once | ORAL | Status: DC

## 2022-08-09 MED ORDER — CEFAZOLIN SODIUM-DEXTROSE 2-4 GM/100ML-% IV SOLN
2.0000 g | INTRAVENOUS | Status: AC
  Administered 2022-08-09: 2 g via INTRAVENOUS

## 2022-08-09 MED ORDER — DEXAMETHASONE SODIUM PHOSPHATE 10 MG/ML IJ SOLN
INTRAMUSCULAR | Status: AC
  Filled 2022-08-09: qty 1

## 2022-08-09 MED ORDER — 0.9 % SODIUM CHLORIDE (POUR BTL) OPTIME
TOPICAL | Status: DC | PRN
  Administered 2022-08-09: 1000 mL

## 2022-08-09 MED ORDER — BUPIVACAINE-EPINEPHRINE (PF) 0.25% -1:200000 IJ SOLN
INTRAMUSCULAR | Status: DC | PRN
  Administered 2022-08-09: 80 mL via SURGICAL_CAVITY

## 2022-08-09 MED ORDER — LACTATED RINGERS IV SOLN: INTRAVENOUS | Status: DC

## 2022-08-09 SURGICAL SUPPLY — 40 items
APL PRP STRL LF DISP 70% ISPRP (MISCELLANEOUS) ×1
CABLE HIGH FREQUENCY MONO STRZ (ELECTRODE) ×1 IMPLANT
CHLORAPREP W/TINT 26 (MISCELLANEOUS) ×1 IMPLANT
DEVICE SECURE STRAP 25 ABSORB (INSTRUMENTS) IMPLANT
DRAPE WARM FLUID 44X44 (DRAPES) ×1 IMPLANT
DRSG TEGADERM 2-3/8X2-3/4 SM (GAUZE/BANDAGES/DRESSINGS) ×2 IMPLANT
DRSG TEGADERM 4X4.75 (GAUZE/BANDAGES/DRESSINGS) ×1 IMPLANT
ELECT REM PT RETURN 9FT ADLT (ELECTROSURGICAL) ×1
ELECTRODE REM PT RTRN 9FT ADLT (ELECTROSURGICAL) ×1 IMPLANT
GAUZE 4X4 16PLY ~~LOC~~+RFID DBL (SPONGE) ×1 IMPLANT
GLOVE BIOGEL PI IND STRL 8 (GLOVE) ×1 IMPLANT
GLOVE ECLIPSE 8.0 STRL XLNG CF (GLOVE) ×1 IMPLANT
GOWN STRL REUS W/TWL XL LVL3 (GOWN DISPOSABLE) ×1 IMPLANT
IRRIG SUCT STRYKERFLOW 2 WTIP (MISCELLANEOUS)
IRRIGATION SUCT STRKRFLW 2 WTP (MISCELLANEOUS) IMPLANT
KIT TURNOVER CYSTO (KITS) ×1 IMPLANT
MANIFOLD NEPTUNE II (INSTRUMENTS) IMPLANT
MESH BARD SOFT 6X6IN (Mesh General) IMPLANT
MESH HERNIA 6X6 BARD (Mesh General) IMPLANT
NEEDLE HYPO 22GX1.5 SAFETY (NEEDLE) ×1 IMPLANT
NS IRRIG 500ML POUR BTL (IV SOLUTION) ×1 IMPLANT
PACK BASIN DAY SURGERY FS (CUSTOM PROCEDURE TRAY) ×1 IMPLANT
PAD POSITIONING PINK XL (MISCELLANEOUS) ×1 IMPLANT
SCISSORS LAP 5X35 DISP (ENDOMECHANICALS) ×1 IMPLANT
SET TUBE SMOKE EVAC HIGH FLOW (TUBING) ×1 IMPLANT
SLEEVE ADV FIXATION 5X100MM (TROCAR) ×1 IMPLANT
SPIKE FLUID TRANSFER (MISCELLANEOUS) ×1 IMPLANT
SPONGE GAUZE 2X2 8PLY STRL LF (GAUZE/BANDAGES/DRESSINGS) ×3 IMPLANT
SPONGE T-LAP 18X18 ~~LOC~~+RFID (SPONGE) ×1 IMPLANT
SUT MNCRL AB 4-0 PS2 18 (SUTURE) ×1 IMPLANT
SUT PDS AB 1 CT1 27 (SUTURE) IMPLANT
SUT VIC AB 2-0 SH 27 (SUTURE)
SUT VIC AB 2-0 SH 27XBRD (SUTURE) IMPLANT
SUT VICRYL 0 UR6 27IN ABS (SUTURE) IMPLANT
TOWEL OR 17X26 10 PK STRL BLUE (TOWEL DISPOSABLE) ×1 IMPLANT
TRAY DSU PREP LF (CUSTOM PROCEDURE TRAY) ×1 IMPLANT
TRAY LAPAROSCOPIC (CUSTOM PROCEDURE TRAY) ×1 IMPLANT
TROCAR ADV FIXATION 5X100MM (TROCAR) ×1 IMPLANT
TROCAR BALLN 12MMX100 BLUNT (TROCAR) ×1 IMPLANT
WATER STERILE IRR 500ML POUR (IV SOLUTION) ×1 IMPLANT

## 2022-08-09 NOTE — H&P (Signed)
08/09/2022    REFERRING PHYSICIAN: Jerlyn Ly, MD  Patient Care Team: Jerlyn Ly, MD as PCP - General (Internal Medicine) Johney Maine, Adrian Saran, MD as Consulting Provider (General Surgery)  PROVIDER: Hollace Kinnier, MD  [facility] MRN: [id number] DOB: [birth year]  SUBJECTIVE   Chief Complaint: Post Operative Visit (Left inguinal hernia)   History of Present Illness: Marcus Richardson is a 82 y.o. male who is seen today  as an office consultation at the request of Dr. Joylene Draft  for evaluation of Left inguinal hernia .   Elderly but very active male. Hikes often. Notes some left groin swelling and diagnosed with a hernia. Became more bothersome over the past year or so especially if he is hiking long distances. Nothing too severe though. Because it seem to be bothering him more, surgical consultation offered. Patient had a history of a fall while he was hiking in [place] with some lacerations and infected knee closure 2 months ago, but that is healed up. No history of other infections. He does not smoke. No diabetes. Very active and hikes several miles without difficulty. Has minimal nocturia, going up to urinate maybe once or 2 nights. He had an appendectomy but no other abdominal surgery. No cardiac or pulmonary issues. No tobacco.  Medical History:  Past Medical History:  Diagnosis Date  Osteoarthritis   Patient Active Problem List  Diagnosis  Sciatica of right side  Spondylolisthesis of lumbar region  Non-recurrent bilateral inguinal hernia without obstruction or gangrene   Past Surgical History:  Procedure Laterality Date  JOINT REPLACEMENT 2005  right hip  INSERTION MORSELIZED BONE ALLOGRAFT FOR SPINE SURGERY N/A 12/28/2016  Procedure: INSERTION MORSELIZED BONE ALLOGRAFT FOR SPINE SURGERY; Surgeon: Donnamarie Rossetti, MD; Location: [facility]; Service: Neurosurgery; Laterality: N/A;  INSTRUMENTATION POSTERIOR SPINE 3 TO 6 VERTEBRAL SEGMENTS N/A 12/28/2016   Procedure: POSTERIOR SEGMENTAL INSTRUMENTATION (EG, PEDICLE FIXATION, DUAL RODS WITH MULTIPLE HOOKS AND SUBLAMINAR WIRES); 3 TO 6 VERTEBRAL SEGMENTS (LIST IN ADDITION TO PRIMARY PROCEDURE); Surgeon: Donnamarie Rossetti, MD; Location: [facility]; Service: Neurosurgery; Laterality: N/A;  FUSION LATERAL INTERBODY DLIF/XLIF Right 12/28/2016  Procedure: FUSION LUMBAR LATERAL INTERBODY DLIF/XLIF; Surgeon: Donnamarie Rossetti, MD; Location: [facility]; Service: Neurosurgery; Laterality: Right;  APPENDECTOMY  COLONOSCOPY  TONSILLECTOMY    Allergies  Allergen Reactions  Adhesive Rash   Current Outpatient Medications on File Prior to Visit  Medication Sig Dispense Refill  acetaminophen (TYLENOL) 325 MG tablet Take 650 mg by mouth every 8 (eight) hours as needed for Pain.  gabapentin (NEURONTIN) 300 MG capsule Take 1 capsule (300 mg total) by mouth 3 (three) times daily. (Patient not taking: Reported on 03/14/2017) 90 capsule 2  naproxen sodium (ALEVE, ANAPROX) 220 MG tablet Take 220 mg by mouth 2 (two) times daily with meals.   No current facility-administered medications on file prior to visit.   Family History  Problem Relation Age of Onset  Breast cancer Mother  Breast cancer Father  Cancer Father  Anesthesia problems Neg Hx    Social History   Tobacco Use  Smoking Status Never  Smokeless Tobacco Never    Social History   Socioeconomic History  Marital status: Married  Tobacco Use  Smoking status: Never  Smokeless tobacco: Never  Substance and Sexual Activity  Alcohol use: Yes  Alcohol/week: 7.0 standard drinks of alcohol  Types: 7 Glasses of wine per week  Drug use: No   ############################################################  Review of Systems: A complete review of systems (ROS) was  obtained from the patient. I have reviewed this information and discussed as appropriate with the patient. See HPI as well for other pertinent ROS.  Constitutional: No  fevers, chills, sweats. Weight stable Eyes: No vision changes, No discharge HENT: No sore throats, nasal drainage Lymph: No neck swelling, No bruising easily Pulmonary: No cough, productive sputum CV: No orthopnea, PND . No exertional chest/neck/shoulder/arm pain. Patient can walk >5 miles without difficulty. Hikes often  GI: No personal nor family history of GI/colon cancer, inflammatory bowel disease, irritable bowel syndrome, allergy such as Celiac Sprue, dietary/dairy problems, colitis, ulcers nor gastritis. No recent sick contacts/gastroenteritis. No travel outside the country. No changes in diet.  Renal: No UTIs, No hematuria Genital: No drainage, bleeding, masses Musculoskeletal: No severe joint pain. Good ROM major joints Skin: No sores or lesions Heme/Lymph: No easy bleeding. No swollen lymph nodes Neuro: No active seizures. No facial droop Psych: No hallucinations. No agitation  OBJECTIVE   Vitals:  06/27/22 1345  BP: 118/62  Pulse: 78  Temp: 36.4 C (97.5 F)  SpO2: 98%  Weight: 83.2 kg (183 lb 6.4 oz)  Height: 175.3 cm ('5\' 9"'$ )   Body mass index is 27.08 kg/m.  PHYSICAL EXAM:  Constitutional: Not cachectic. Hygeine adequate. Vitals signs as above.  Eyes: No glasses. Vision adequate,Pupils reactive, normal extraocular movements. Sclera nonicteric Neuro: CN II-XII intact. No major focal sensory defects. No major motor deficits. Lymph: No head/neck/groin lymphadenopathy Psych: No severe agitation. No severe anxiety. Judgment & insight Adequate, Oriented x4, HENT: Normocephalic, Mucus membranes moist. No thrush. Hearing: adequate Neck: Supple, No tracheal deviation. No obvious thyromegaly Chest: No pain to chest wall compression. Good respiratory excursion. No audible wheezing CV: Pulses intact. regular. No major extremity edema Ext: No obvious deformity or contracture. Edema: Not present. No cyanosis Skin: No major subcutaneous nodules. Warm and  dry Musculoskeletal: Severe joint rigidity not present. Moderate kyphosis no obvious clubbing. No digital petechiae. Mobility: no assist device moving easily without restrictions  Abdomen: Flat Soft. Nondistended. Nontender. Hernia: Not present. Diastasis recti: Not present. No hepatomegaly. No splenomegaly.  Genital/Pelvic: Inguinal hernia: Obvious left inguinal hernia and groin reducible. Impulse in the medial right groin suspicious for small direct space inguinal hernia. Mild testicular atrophy but no masses. Otherwise normal external genitalia.. Inguinal lymph nodes: without lymphadenopathy nor hidradenitis.   Rectal: (Deferred)    ###################################################################  Labs, Imaging and Diagnostic Testing:  Located in 'Care Everywhere' section of Epic EMR chart  PRIOR [facility] NOTES:  Not applicable  SURGERY NOTES:  Not applicable  PATHOLOGY:  Not applicable  Assessment and Plan:  DIAGNOSES: Pleasant active gentleman with obvious left and probable small right inguinal hernias.  Diagnoses and all orders for this visit:  Non-recurrent bilateral inguinal hernia without obstruction or gangrene    ASSESSMENT/PLAN  Standard of care is to consider repair. I think he is a good candidate for laparoscopic approach with outpatient surgery. While he is 82 years old, he is in excellent condition and exercises with long hikes regularly. My hope is that his risks of surgery are rather low. No major nocturia. Hopefully outpatient surgery.  The anatomy & physiology of the abdominal wall and pelvic floor was discussed. The pathophysiology of hernias in the inguinal and pelvic region was discussed. Natural history risks such as progressive enlargement, pain, incarceration, and strangulation was discussed. Contributors to complications such as smoking, obesity, diabetes, prior surgery, etc were discussed.   I feel the risks of no intervention will lead to  serious problems that outweigh the operative risks; therefore, I recommended surgery to reduce and repair the hernia. I explained laparoscopic techniques with possible need for an open approach. I noted usual use of mesh to patch and/or buttress hernia repair  Risks such as bleeding, infection, abscess, need for further treatment, injury to other organs, need for repair of tissues / organs, stroke, heart attack, death, and other risks were discussed. I noted a good likelihood this will help address the problem. Goals of post-operative recovery were discussed as well. Possibility that this will not correct all symptoms was explained. I stressed the importance of low-impact activity, aggressive pain control, avoiding constipation, & not pushing through pain to minimize risk of post-operative chronic pain or injury. Possibility of reherniation was discussed. We will work to minimize complications.   An educational handout further explaining the pathology & treatment options was given as well. Questions were answered. The patient expresses understanding & wishes to proceed with surgery.  He is interested in proceeding as soon as possible so he has time to recover before he goes back to hiking trips in [place] and elsewhere starting in April.   Marland Kitchenscgi 08/09/2022       06/27/2022

## 2022-08-09 NOTE — Transfer of Care (Signed)
Immediate Anesthesia Transfer of Care Note  Patient: Marcus Richardson.  Procedure(s) Performed: Procedure(s) (LRB): LAPAROSCOPIC BILATERAL INGUINAL HERNIA REPAIRS WITH MESH (Bilateral)  Patient Location: PACU  Anesthesia Type: General  Level of Consciousness: awake, alert  and oriented  Airway & Oxygen Therapy: Patient Spontanous Breathing and Patient connected to nasal cannula oxygen  Post-op Assessment: Report given to PACU RN and Post -op Vital signs reviewed and stable  Post vital signs: Reviewed and stable  Complications: No apparent anesthesia complications  Last Vitals:  Vitals Value Taken Time  BP 126/74 08/09/22 0930  Temp 36.3 C 08/09/22 0921  Pulse 55 08/09/22 0930  Resp 16 08/09/22 0930  SpO2 99 % 08/09/22 0930  Vitals shown include unvalidated device data.  Last Pain:  Vitals:   08/09/22 0921  TempSrc:   PainSc: 0-No pain      Patients Stated Pain Goal: 5 (89/79/15 0413)  Complications: No notable events documented.

## 2022-08-09 NOTE — Anesthesia Procedure Notes (Signed)
Procedure Name: Intubation Date/Time: 08/09/2022 7:48 AM  Performed by: Mechele Claude, CRNAPre-anesthesia Checklist: Patient identified, Emergency Drugs available, Suction available and Patient being monitored Patient Re-evaluated:Patient Re-evaluated prior to induction Oxygen Delivery Method: Circle system utilized Preoxygenation: Pre-oxygenation with 100% oxygen Induction Type: IV induction Ventilation: Mask ventilation without difficulty Laryngoscope Size: Mac and 4 Grade View: Grade I Tube type: Oral Tube size: 7.5 mm Number of attempts: 1 Airway Equipment and Method: Stylet and Oral airway Placement Confirmation: ETT inserted through vocal cords under direct vision, positive ETCO2 and breath sounds checked- equal and bilateral Secured at: 23 cm Tube secured with: Tape Dental Injury: Teeth and Oropharynx as per pre-operative assessment

## 2022-08-09 NOTE — Op Note (Signed)
08/09/2022  9:21 AM  PATIENT:  Marcus Richardson.  82 y.o. male  Patient Care Team: Crist Infante, MD as PCP - General (Internal Medicine) Festus Aloe, MD as Consulting Physician (Urology) Michael Boston, MD as Consulting Physician (General Surgery) Melrose Nakayama, MD as Consulting Physician (Orthopedic Surgery)  PRE-OPERATIVE DIAGNOSIS:  BILATERAL INGUINAL HERNIA  POST-OPERATIVE DIAGNOSIS:  BILATERAL INGUINAL HERNIA  PROCEDURE:   LAPAROSCOPIC BILATERAL INGUINAL HERNIA REPAIRS WITH MESH TRANSVERSUS ABDOMINIS PLANE (TAP) BLOCK - BILATERAL  SURGEON:  Adin Hector, MD  ASSISTANT: None  ANESTHESIA:     Regional ilioinguinal and genitofemoral and spermatic cord nerve blocks  General  Regional TRANSVERSUS ABDOMINIS PLANE (TAP) nerve block for perioperative & postoperative pain control provided with liposomal bupivacaine (Experel) mixed with 0.25% bupivacaine as a Bilateral TAP block x 87m each side at the level of the transverse abdominis & preperitoneal spaces along the flank at the anterior axillary line, from subcostal ridge to iliac crest under laparoscopic guidance    EBL:  Total I/O In: 600 [I.V.:500; IV Piggyback:100] Out: - .  See anesthesia record  Delay start of Pharmacological VTE agent (>24hrs) due to surgical blood loss or risk of bleeding:  no  DRAINS: NONE  SPECIMEN:  NONE  DISPOSITION OF SPECIMEN:  N/A  COUNTS:  YES  PLAN OF CARE: Discharge to home after PACU  PATIENT DISPOSITION:  PACU - hemodynamically stable.  INDICATION: Active hiking male with obvious left groin bulge consistent with inguinal hernia.  Possible contralateral right inguinal hernia on Valsalva as well.  I recommended diagnostic laparoscopy and repair of hernias pound.  The anatomy & physiology of the abdominal wall and pelvic floor was discussed.  The pathophysiology of hernias in the inguinal and pelvic region was discussed.  Natural history risks such as progressive enlargement,  pain, incarceration & strangulation was discussed.   Contributors to complications such as smoking, obesity, diabetes, prior surgery, etc were discussed.    I feel the risks of no intervention will lead to serious problems that outweigh the operative risks; therefore, I recommended surgery to reduce and repair the hernia.  I explained laparoscopic techniques with possible need for an open approach.  I noted usual use of mesh to patch and/or buttress hernia repair  Risks such as bleeding, infection, abscess, need for further treatment, heart attack, death, and other risks were discussed.  I noted a good likelihood this will help address the problem.   Goals of post-operative recovery were discussed as well.  Possibility that this will not correct all symptoms was explained.  I stressed the importance of low-impact activity, aggressive pain control, avoiding constipation, & not pushing through pain to minimize risk of post-operative chronic pain or injury. Possibility of reherniation was discussed.  We will work to minimize complications.     An educational handout further explaining the pathology & treatment options was given as well.  Questions were answered.  The patient expresses understanding & wishes to proceed with surgery.  OR FINDINGS:  Left side direct greater indirect pantaloon type inguinal hernia.  No obvious femoral nor obturator hernia.   On the right side mild laxity direct space but no definite hernia there.  Subtle but definite indirect inguinal hernia.  No femoral nor obturator hernia.    Mild tortuosity of internal iliacs.  DESCRIPTION:  The patient was identified & brought into the operating room. The patient was positioned supine with arms tucked. SCDs were active during the entire case. The patient underwent general anesthesia without  any difficulty.  The abdomen was prepped and draped in a sterile fashion. The patient's bladder was emptied.  A Surgical Timeout confirmed our  plan.  I made a transverse incision through the inferior umbilical fold.  I made a small transverse nick through the anterior rectus fascia contralateral to the inguinal hernia side and placed a 0-vicryl stitch through the fascia.  I placed a Hasson trocar into the preperitoneal plane.  Entry was clean.  We induced carbon dioxide insufflation. Camera inspection revealed no injury.  I used a 45m angled scope to bluntly free the peritoneum off the infraumbilical anterior abdominal wall.  I created enough of a preperitoneal pocket to place 523mports into the right & left mid-abdomen into this preperitoneal cavity.  I focused attention on the LEFT pelvis since that was the dominant hernia side.   I used blunt & focused sharp dissection to free the peritoneum off the flank and down to the pubic rim.  I freed the anteriolateral bladder wall off the anteriolateral pelvic wall, sparing midline attachments.   I located a swath of peritoneum going into a hernia fascial defect at the  direct space consistent with  a direct space inguinal hernia..  I gradually freed the peritoneal hernia sac off safely and reduced it into the preperitoneal space.  I freed the peritoneum off the spermatic vessels & vas deferens.  I freed peritoneum off the retroperitoneum along the psoas muscle.  Patient clearly had an indirect inguinal hernia.  Freed the hernia sac and reduced a few small spermatic cord lipomas out of the inguinal canal & removed.  I checked & assured hemostasis.     I turned attention on the opposite  RIGHT pelvis.  I did dissection in a similar, mirror-image fashion. The patient had an indirect inguinal hernia..   Ingunial canal cord lipoma was dissected away & removed.    I checked & assured hemostasis.     I chose sheets of medium-weight polypropylene Bard Marlex 15x15cm, one for left each side.  I chose a light weight polypropylene Bard soft 15 x 15 cm mesh for the right side since it was a much smaller defect.  I  cut a single sigmoid-shaped slit ~6cm from a corner of each mesh.  I placed the meshes into the preperitoneal space & laid them as overlapping diamonds such that at the inferior points, a 6x6 cm corner flap rested in the true anterolateral pelvis, covering the obturator & femoral foramina.   I allowed the bladder to return to the pubis, this helping tuck the corners of the mesh in the anteriolateral pelvis.  The medial corners overlapped each other across midline cephalad to the pubic rim.   Given the numerous hernias of moderate size, I placed a third Bard Marlex 15x15cm mesh in the center as a vertical diamond.  The lateral wings of the mesh overlap across the direct spaces and internal rings where the dominant hernias were.  This provided good coverage and reinforcement of the hernia repairs.  Because of the central mesh placement with good overlap, I did not place any tacks.   I held the hernia sacs cephalad & evacuated carbon dioxide.  I closed the fascia with absorbable suture.  I closed the skin using 4-0 monocryl stitch.  Sterile dressings were applied.   The patient was extubated & arrived in the PACU in stable condition..  I had discussed postoperative care with the patient in the holding area.  Instructions are written in the  chart.  I discussed operative findings, updated the patient's status, discussed probable steps to recovery, and gave postoperative recommendations to the patient's spouse, June Capozzi.  Recommendations were made.  Questions were answered.  She expressed understanding & appreciation.   Adin Hector, M.D., F.A.C.S. Gastrointestinal and Minimally Invasive Surgery [facility] [address], [place], [place] [address] [phone] Main / Paging  08/09/2022 9:21 AM

## 2022-08-09 NOTE — Anesthesia Postprocedure Evaluation (Signed)
Anesthesia Post Note  Patient: Marcus Richardson.  Procedure(s) Performed: LAPAROSCOPIC BILATERAL INGUINAL HERNIA REPAIRS WITH MESH (Bilateral)     Patient location during evaluation: PACU Anesthesia Type: General Level of consciousness: awake and alert Pain management: pain level controlled Vital Signs Assessment: post-procedure vital signs reviewed and stable Respiratory status: spontaneous breathing, nonlabored ventilation and respiratory function stable Cardiovascular status: stable and blood pressure returned to baseline Anesthetic complications: no   No notable events documented.  Last Vitals:  Vitals:   08/09/22 1000 08/09/22 1051  BP: 116/71 108/64  Pulse: (!) 54 (!) 51  Resp: 18   Temp: (!) 36.3 C 36.5 C  SpO2: 93% 95%    Last Pain:  Vitals:   08/09/22 1051  TempSrc:   PainSc: [place] Renn[place]

## 2022-08-09 NOTE — Discharge Instructions (Addendum)
HERNIA REPAIR: POST OP INSTRUCTIONS  ######################################################################  EAT Gradually transition to a high fiber diet with a fiber supplement over the next few weeks after discharge.  Start with a pureed / full liquid diet (see below)  WALK Walk an hour a day.  Control your pain to do that.    CONTROL PAIN Control pain so that you can walk, sleep, tolerate sneezing/coughing, and go up/down stairs.  HAVE A BOWEL MOVEMENT DAILY Keep your bowels regular to avoid problems.  OK to try a laxative to override constipation.  OK to use an antidairrheal to slow down diarrhea.  Call if not better after 2 tries  CALL IF YOU HAVE PROBLEMS/CONCERNS Call if you are still struggling despite following these instructions. Call if you have concerns not answered by these instructions  ######################################################################    DIET: Follow a light bland diet & liquids the first 24 hours after arrival home, such as soup, liquids, starches, etc.  Be sure to drink plenty of fluids.  Quickly advance to a usual solid diet within a few days.  Avoid fast food or heavy meals as your are more likely to get nauseated or have irregular bowels.  A low-fat, high-fiber diet for the rest of your life is ideal.   Take your usually prescribed home medications unless otherwise directed.  PAIN CONTROL: Pain is best controlled by a usual combination of three different methods TOGETHER: Ice/Heat Over the counter pain medication Prescription pain medication Most patients will experience some swelling and bruising around the hernia(s) such as the bellybutton, groins, or old incisions.  Ice packs or heating pads (30-60 minutes up to 6 times a day) will help. Use ice for the first few days to help decrease swelling and bruising, then switch to heat to help relax tight/sore spots and speed recovery.  Some people prefer to use ice alone, heat alone, alternating  between ice & heat.  Experiment to what works for you.  Swelling and bruising can take several weeks to resolve.   It is helpful to take an over-the-counter pain medication regularly for the first few weeks.  Choose one of the following that works best for you: Naproxen (Aleve, etc)  Two 244m tabs twice a day Ibuprofen (Advil, etc) Three 2045mtabs four times a day (every meal & bedtime) Acetaminophen (Tylenol, etc) 325-65043mour times a day (every meal & bedtime) A  prescription for pain medication should be given to you upon discharge.  Take your pain medication as prescribed.  If you are having problems/concerns with the prescription medicine (does not control pain, nausea, vomiting, rash, itching, etc), please call us [phone] see if we need to switch you to a different pain medicine that will work better for you and/or control your side effect better. If you need a refill on your pain medication, please contact your pharmacy.  They will contact our office to request authorization. Prescriptions will not be filled after 5 pm or on week-ends.  Avoid getting constipated.  Between the surgery and the pain medications, it is common to experience some constipation.  Increasing fluid intake and taking a fiber supplement (such as Metamucil, Citrucel, FiberCon, MiraLax, etc) 1-2 times a day regularly will usually help prevent this problem from occurring.  A mild laxative (prune juice, Milk of Magnesia, MiraLax, etc) should be taken according to package directions if there are no bowel movements after 48 hours.    Wash / shower every day.  You may shower over the dressings  as they are waterproof.    It is good for closed incisions and even open wounds to be washed every day.  Shower every day.  Short baths are fine.  Wash the incisions and wounds clean with soap & water.    You may leave closed incisions open to air if it is dry.   You may cover the incision with clean gauze & replace it after  your daily shower for comfort.  TEGADERM:  You have clear gauze band-aid dressings over your closed incision(s).  Remove the dressings 3 days after surgery.= Friday 08/12/2022    ACTIVITIES as tolerated:   You may resume regular (light) daily activities beginning the next day--such as daily self-care, walking, climbing stairs--gradually increasing activities as tolerated.  Control your pain so that you can walk an hour a day.  If you can walk 30 minutes without difficulty, it is safe to try more intense activity such as jogging, treadmill, bicycling, low-impact aerobics, swimming, etc. Save the most intensive and strenuous activity for last such as sit-ups, heavy lifting, contact sports, etc  Refrain from any heavy lifting or straining until you are off narcotics for pain control.   DO NOT PUSH THROUGH PAIN.  Let pain be your guide: If it hurts to do something, don't do it.  Pain is your body warning you to avoid that activity for another week until the pain goes down. You may drive when you are no longer taking prescription pain medication, you can comfortably wear a seatbelt, and you can safely maneuver your car and apply brakes. You may have sexual intercourse when it is comfortable.   FOLLOW UP in our office Please call [facility] at [phone] to set up an appointment to see your surgeon in the office for a follow-up appointment approximately 2-3 weeks after your surgery. Make sure that you call for this appointment the day you arrive home to insure a convenient appointment time.  9.  If you have disability of FMLA / Family leave forms, please bring the forms to the office for processing.  (do not give to your surgeon).  WHEN TO CALL us [phone]: Poor pain control Reactions / problems with new medications (rash/itching, nausea, etc)  Fever over 101.5 F (38.5 C) Inability to urinate Nausea and/or vomiting Worsening swelling or bruising Continued bleeding from incision. Increased pain,  redness, or drainage from the incision   The clinic staff is available to answer your questions during regular business hours (8:30am-5pm).  Please don't hesitate to call and ask to speak to one of our nurses for clinical concerns.   If you have a medical emergency, go to the nearest emergency room or call 911.  A surgeon from [facility] is always on call at the hospitals in [facility], [place], [place], [place], [place]  [address] ?  [address] [address], [place], [place]   [address] MAIN: [phone] ? TOLL FREE: [phone] ? FAX: [phone] [email or web address]    No acetaminophen/Tylenol until after 12:00pm today if needed for pain.   Information for Discharge Teaching: EXPAREL (bupivacaine liposome injectable suspension)   Your surgeon or anesthesiologist gave you EXPAREL(bupivacaine) to help control your pain after surgery.  EXPAREL is a local anesthetic that provides pain relief by numbing the tissue around the surgical site. EXPAREL is designed to release pain medication over time and can control pain for up to 72 hours. Depending on how you respond to EXPAREL, you may  require less pain medication during your recovery.  Possible side effects: Temporary loss of sensation or ability to move in the area where bupivacaine was injected. Nausea, vomiting, constipation Rarely, numbness and tingling in your mouth or lips, lightheadedness, or anxiety may occur. Call your doctor right away if you think you may be experiencing any of these sensations, or if you have other questions regarding possible side effects.  Follow all other discharge instructions given to you by your surgeon or nurse. Eat a healthy diet and drink plenty of water or other fluids.  If you return to the hospital for any reason within 96 hours following the administration of EXPAREL, it is important for health care providers to know that you have  received this anesthetic. A teal colored band has been placed on your arm with the date, time and amount of EXPAREL you have received in order to alert and inform your health care providers. Please leave this armband in place for the full 96 hours following administration, and then you may remove the band.  Band can be removed on Saturday August 13, 2022.    Post Anesthesia Home Care Instructions  Activity: Get plenty of rest for the remainder of the day. A responsible individual must stay with you for 24 hours following the procedure.  For the next 24 hours, DO NOT: -Drive a car -Paediatric nurse -Drink alcoholic beverages -Take any medication unless instructed by your physician -Make any legal decisions or sign important papers.  Meals: Start with liquid foods such as gelatin or soup. Progress to regular foods as tolerated. Avoid greasy, spicy, heavy foods. If nausea and/or vomiting occur, drink only clear liquids until the nausea and/or vomiting subsides. Call your physician if vomiting continues.  Special Instructions/Symptoms: Your throat may feel dry or sore from the anesthesia or the breathing tube placed in your throat during surgery. If this causes discomfort, gargle with warm salt water. The discomfort should disappear within 24 hours.

## 2022-08-09 NOTE — Interval H&P Note (Signed)
History and Physical Interval Note:  08/09/2022 7:34 AM  Marcus Richardson.  has presented today for surgery, with the diagnosis of BILATERAL INGUINAL HERNIA.  The various methods of treatment have been discussed with the patient and family. After consideration of risks, benefits and other options for treatment, the patient has consented to  Procedure(s): LAPAROSCOPIC BILATERAL INGUINAL HERNIA REPAIRS WITH MESH (Bilateral) as a surgical intervention.  The patient's history has been reviewed, patient examined, no change in status, stable for surgery.  I have reviewed the patient's chart and labs.  Questions were answered to the patient's satisfaction.    I have re-reviewed the the patient's records, history, medications, and allergies.  I have re-examined the patient.  I again discussed intraoperative plans and goals of post-operative recovery.  The patient agrees to proceed.  Marcus Richardson.  [birth year] [id number]  Patient Care Team: Crist Infante, MD as PCP - General (Internal Medicine) Festus Aloe, MD as Consulting Physician (Urology) Michael Boston, MD as Consulting Physician (General Surgery) Melrose Nakayama, MD as Consulting Physician (Orthopedic Surgery)  There are no problems to display for this patient.   Past Medical History:  Diagnosis Date   DDD (degenerative disc disease), cervical    DDD (degenerative disc disease), lumbosacral    History of kidney stones 2021   Hyperlipidemia    no meds   Inguinal hernia without obstruction or gangrene    left side w/ possible right side   Leg laceration 04/13/2022   deep laceration to right lower leg, as of 08/05/22 patient states that laceration is healed   OA (osteoarthritis)    Sciatica, right side    Spondylolysis    T12--L1  severe   Vitamin D deficiency    taking Vitamin D supplements    Past Surgical History:  Procedure Laterality Date   ANTERIOR / POSTERIOR COMBINED FUSION LUMBAR SPINE  12/30/2016   '@Marcus Richardson'$ ;    Posterior and anterior L4--5 XLIF w/ percutanous screws   APPENDECTOMY     around Marcus Richardson PROPOFOL  06/13/2013   dr Henrene Pastor   eye operation     about 82 years old, strabismus correction   TONSILLECTOMY     as a child   TOTAL HIP ARTHROPLASTY Right 2005   hip resurfacing per pt    Social History   Socioeconomic History   Marital status: Married    Spouse name: Not on file   Number of children: Not on file   Years of education: Not on file   Highest education level: Not on file  Occupational History   Not on file  Tobacco Use   Smoking status: Never   Smokeless tobacco: Never  Vaping Use   Vaping Use: Never used  Substance and Sexual Activity   Alcohol use: Yes    Alcohol/week: 7.0 standard drinks of alcohol    Types: 7 Glasses of wine per week   Drug use: No   Sexual activity: Not on file  Other Topics Concern   Not on file  Social History Narrative   Not on file   Social Determinants of Health   Financial Resource Strain: Not on file  Food Insecurity: Not on file  Transportation Needs: Not on file  Physical Activity: Not on file  Stress: Not on file  Social Connections: Not on file  Intimate Partner Violence: Not on file    Family History  Problem Relation Age of Onset   Hyperlipidemia Mother    Depression Sister  Hypertension Brother    Hyperlipidemia Brother    Colon cancer Neg Hx    Esophageal cancer Neg Hx    Rectal cancer Neg Hx    Stomach cancer Neg Hx     Medications Prior to Admission  Medication Sig Dispense Refill Last Dose   colchicine 0.6 MG tablet Take 0.6 mg by mouth daily.   08/08/2022   Vitamin D, Ergocalciferol, (DRISDOL) 1.25 MG (50000 UNIT) CAPS capsule Take 50,000 Units by mouth every 7 (seven) days.   08/08/2022    Current Facility-Administered Medications  Medication Dose Route Frequency Provider Last Rate Last Admin   bupivacaine liposome (EXPAREL) 1.3 % injection 266 mg  20 mL Infiltration Once Michael Boston, MD        ceFAZolin (ANCEF) IVPB 2g/100 mL premix  2 g Intravenous On Call to OR Michael Boston, MD       Chlorhexidine Gluconate Cloth 2 % PADS 6 each  6 each Topical Once Michael Boston, MD       And   Chlorhexidine Gluconate Cloth 2 % PADS 6 each  6 each Topical Once Michael Boston, MD       Derrill Memo ON 08/10/2022] feeding supplement (ENSURE PRE-SURGERY) liquid 296 mL  296 mL Oral Once Michael Boston, MD       lactated ringers infusion   Intravenous Continuous Lyn Hollingshead, MD 50 mL/hr at 08/09/22 0716 Continued from Pre-op at 08/09/22 0716     Allergies  Allergen Reactions   Latex Rash   Tape Rash    BP 129/79   Pulse (!) 53   Temp (!) 97.5 F (36.4 C) (Oral)   Resp 16   Ht '5\' 9"'$  (1.753 m)   Wt 83.1 kg   SpO2 97%   BMI 27.07 kg/m   Labs: No results found for this or any previous visit (from the past 48 hour(s)).  Imaging / Studies: No results found.   Adin Hector, M.D., F.A.C.S. Gastrointestinal and Minimally Invasive Surgery [facility] [address] [place],  [id number] [phone] Main / Paging  08/09/2022 7:34 AM \  Adin Hector

## 2022-08-10 ENCOUNTER — Encounter (HOSPITAL_BASED_OUTPATIENT_CLINIC_OR_DEPARTMENT_OTHER): Payer: Self-pay | Admitting: Surgery

## 2022-08-19 DIAGNOSIS — Z23 Encounter for immunization: Secondary | ICD-10-CM | POA: Diagnosis not present

## 2022-10-17 DIAGNOSIS — M109 Gout, unspecified: Secondary | ICD-10-CM | POA: Diagnosis not present

## 2022-10-17 DIAGNOSIS — E785 Hyperlipidemia, unspecified: Secondary | ICD-10-CM | POA: Diagnosis not present

## 2022-10-17 DIAGNOSIS — R7301 Impaired fasting glucose: Secondary | ICD-10-CM | POA: Diagnosis not present

## 2022-10-17 DIAGNOSIS — R7989 Other specified abnormal findings of blood chemistry: Secondary | ICD-10-CM | POA: Diagnosis not present

## 2022-10-17 DIAGNOSIS — E559 Vitamin D deficiency, unspecified: Secondary | ICD-10-CM | POA: Diagnosis not present

## 2022-10-17 DIAGNOSIS — Z125 Encounter for screening for malignant neoplasm of prostate: Secondary | ICD-10-CM | POA: Diagnosis not present

## 2022-10-17 DIAGNOSIS — K219 Gastro-esophageal reflux disease without esophagitis: Secondary | ICD-10-CM | POA: Diagnosis not present

## 2022-10-19 DIAGNOSIS — H0288A Meibomian gland dysfunction right eye, upper and lower eyelids: Secondary | ICD-10-CM | POA: Diagnosis not present

## 2022-10-19 DIAGNOSIS — H0288B Meibomian gland dysfunction left eye, upper and lower eyelids: Secondary | ICD-10-CM | POA: Diagnosis not present

## 2022-10-19 DIAGNOSIS — H40012 Open angle with borderline findings, low risk, left eye: Secondary | ICD-10-CM | POA: Diagnosis not present

## 2022-10-19 DIAGNOSIS — H2513 Age-related nuclear cataract, bilateral: Secondary | ICD-10-CM | POA: Diagnosis not present

## 2022-10-21 DIAGNOSIS — Z0000 Encounter for general adult medical examination without abnormal findings: Secondary | ICD-10-CM | POA: Diagnosis not present

## 2022-10-21 DIAGNOSIS — M8580 Other specified disorders of bone density and structure, unspecified site: Secondary | ICD-10-CM | POA: Diagnosis not present

## 2022-10-21 DIAGNOSIS — R7301 Impaired fasting glucose: Secondary | ICD-10-CM | POA: Diagnosis not present

## 2022-10-21 DIAGNOSIS — I2510 Atherosclerotic heart disease of native coronary artery without angina pectoris: Secondary | ICD-10-CM | POA: Diagnosis not present

## 2022-10-21 DIAGNOSIS — Z23 Encounter for immunization: Secondary | ICD-10-CM | POA: Diagnosis not present

## 2022-10-21 DIAGNOSIS — R82998 Other abnormal findings in urine: Secondary | ICD-10-CM | POA: Diagnosis not present

## 2022-10-21 DIAGNOSIS — I700 Atherosclerosis of aorta: Secondary | ICD-10-CM | POA: Diagnosis not present

## 2022-10-21 DIAGNOSIS — K4090 Unilateral inguinal hernia, without obstruction or gangrene, not specified as recurrent: Secondary | ICD-10-CM | POA: Diagnosis not present

## 2022-10-21 DIAGNOSIS — E785 Hyperlipidemia, unspecified: Secondary | ICD-10-CM | POA: Diagnosis not present

## 2022-10-21 DIAGNOSIS — M109 Gout, unspecified: Secondary | ICD-10-CM | POA: Diagnosis not present

## 2022-11-27 DIAGNOSIS — Z1211 Encounter for screening for malignant neoplasm of colon: Secondary | ICD-10-CM | POA: Diagnosis not present

## 2023-01-03 DIAGNOSIS — Z86018 Personal history of other benign neoplasm: Secondary | ICD-10-CM | POA: Diagnosis not present

## 2023-01-03 DIAGNOSIS — Z808 Family history of malignant neoplasm of other organs or systems: Secondary | ICD-10-CM | POA: Diagnosis not present

## 2023-01-03 DIAGNOSIS — L814 Other melanin hyperpigmentation: Secondary | ICD-10-CM | POA: Diagnosis not present

## 2023-01-03 DIAGNOSIS — D485 Neoplasm of uncertain behavior of skin: Secondary | ICD-10-CM | POA: Diagnosis not present

## 2023-01-03 DIAGNOSIS — L821 Other seborrheic keratosis: Secondary | ICD-10-CM | POA: Diagnosis not present

## 2023-01-03 DIAGNOSIS — D225 Melanocytic nevi of trunk: Secondary | ICD-10-CM | POA: Diagnosis not present

## 2023-01-03 DIAGNOSIS — D2271 Melanocytic nevi of right lower limb, including hip: Secondary | ICD-10-CM | POA: Diagnosis not present

## 2023-01-03 DIAGNOSIS — L578 Other skin changes due to chronic exposure to nonionizing radiation: Secondary | ICD-10-CM | POA: Diagnosis not present

## 2023-04-18 DIAGNOSIS — Z23 Encounter for immunization: Secondary | ICD-10-CM | POA: Diagnosis not present

## 2023-10-12 ENCOUNTER — Ambulatory Visit (HOSPITAL_COMMUNITY): Admission: EM | Admit: 2023-10-12 | Discharge: 2023-10-12 | Disposition: A | Discharge status: Home / Self Care

## 2023-10-12 ENCOUNTER — Ambulatory Visit (HOSPITAL_COMMUNITY)

## 2023-10-12 ENCOUNTER — Other Ambulatory Visit: Payer: Self-pay

## 2023-10-12 ENCOUNTER — Encounter (HOSPITAL_COMMUNITY): Payer: Self-pay | Admitting: Emergency Medicine

## 2023-10-12 DIAGNOSIS — R051 Acute cough: Secondary | ICD-10-CM | POA: Diagnosis not present

## 2023-10-12 DIAGNOSIS — R069 Unspecified abnormalities of breathing: Secondary | ICD-10-CM | POA: Diagnosis not present

## 2023-10-12 DIAGNOSIS — J22 Unspecified acute lower respiratory infection: Secondary | ICD-10-CM

## 2023-10-12 DIAGNOSIS — R059 Cough, unspecified: Secondary | ICD-10-CM | POA: Diagnosis not present

## 2023-10-12 MED ORDER — ALBUTEROL SULFATE HFA 108 (90 BASE) MCG/ACT IN AERS: 1.0000 | INHALATION_SPRAY | Freq: Four times a day (QID) | RESPIRATORY_TRACT | 0 refills | Status: AC | PRN

## 2023-10-12 MED ORDER — AZITHROMYCIN 250 MG PO TABS: ORAL_TABLET | ORAL | 0 refills | Status: AC

## 2023-10-12 MED ORDER — BENZONATATE 100 MG PO CAPS: 100.0000 mg | ORAL_CAPSULE | Freq: Three times a day (TID) | ORAL | 0 refills | Status: AC

## 2023-10-12 NOTE — ED Triage Notes (Signed)
 Saturday started feeling unwell.  Initially felt sinus irritation, than throat and now "bronchial irritation".  Has lethargy, weakness, coughing, particularly at night.  Unknown fever.    Patient was in [place] when symptoms started returned home this afternoon.    Went to a Teacher, early years/pre in [place] and received decongestant med and cough med.  Alllergan was another medication he received.

## 2023-10-12 NOTE — Discharge Instructions (Addendum)
 Chest x-ray done today. Final results are pending final review by the radiologist but on brief review there is concern for enlarged bronchus and possible infiltrate consistent with pneumonia. Flu and covid testing deferred as symptoms have been going on for 5 days. Given this and the duration of symptoms we will treat with the following:  Azithromycin 250mg  Take 2 tablets today and the 1 tablet daily for 4 more days. (Hold the colchicine while on the antibiotic) Benzonatate (tessalon) 100 mg every 8 hours as needed for cough.  Albuterol inhaler 1-2 puffs every 6 hours as needed for wheezing/shortness of breath.  Rest and stay hydrated.  Drink plenty of fluids even if you do not feel like taking in solid foods Return to urgent care or PCP if symptoms worsen or fail to resolve.

## 2023-10-12 NOTE — ED Provider Notes (Signed)
 [facility]    CSN: [id number] Arrival date & time: 10/12/23  1651      History   Chief Complaint Chief Complaint  Patient presents with   Cough    HPI Marcus Richardson. is a 83 y.o. male.   83 year old male who presents urgent care with complaints of cough, nasal congestion, chest congestion, fatigue and generalized weakness.  This started on Saturday while he was in [place].  He was in [place] doing a 6-day hostel to hostel hike then 2 to 3 days in [place].  He reports that there was someone who was recovering from an illness when they left and then throughout the trip 3 other people got sick.  He is unsure if he has been running any fevers.  He reports the cough is much worse at night.  He does feel a lot of chest congestion now. He has a poor appetite also.      Cough Associated symptoms: no chest pain, no chills, no ear pain, no fever, no rash, no shortness of breath and no sore throat     Past Medical History:  Diagnosis Date   DDD (degenerative disc disease), cervical    DDD (degenerative disc disease), lumbosacral    History of kidney stones 2021   Hyperlipidemia    no meds   Inguinal hernia without obstruction or gangrene    left side w/ possible right side   Leg laceration 04/13/2022   deep laceration to right lower leg, as of 08/05/22 patient states that laceration is healed   OA (osteoarthritis)    Sciatica, right side    Spondylolysis    T12--L1  severe   Vitamin D deficiency    taking Vitamin D supplements    There are no active problems to display for this patient.   Past Surgical History:  Procedure Laterality Date   ANTERIOR / POSTERIOR COMBINED FUSION LUMBAR SPINE  12/30/2016   @[facility] ;   Posterior and anterior L4--5 XLIF w/ percutanous screws   APPENDECTOMY     around 1990   COLONOSCOPY WITH PROPOFOL  06/13/2013   dr Marina Goodell   eye operation     about 83 years old, strabismus correction   INGUINAL HERNIA REPAIR Bilateral  08/09/2022   Procedure: LAPAROSCOPIC BILATERAL INGUINAL HERNIA REPAIRS WITH MESH;  Surgeon: Karie Soda, MD;  Location: [facility];  Service: General;  Laterality: Bilateral;   TONSILLECTOMY     as a child   TOTAL HIP ARTHROPLASTY Right 2005   hip resurfacing per pt       Home Medications    Prior to Admission medications   Medication Sig Start Date End Date Taking? Authorizing Provider  rosuvastatin (CRESTOR) 20 MG tablet Take 20 mg by mouth daily. 07/28/23  Yes [provider]  colchicine 0.6 MG tablet Take 0.6 mg by mouth daily.    [provider]  traMADol (ULTRAM) 50 MG tablet Take 1-2 tablets (50-100 mg total) by mouth every 6 (six) hours as needed for moderate pain or severe pain. 08/09/22   Karie Soda, MD  Vitamin D, Ergocalciferol, (DRISDOL) 1.25 MG (50000 UNIT) CAPS capsule Take 50,000 Units by mouth every 7 (seven) days.    [provider]    Family History Family History  Problem Relation Age of Onset   Hyperlipidemia Mother    Depression Sister    Hypertension Brother    Hyperlipidemia Brother    Colon cancer Neg Hx    Esophageal  cancer Neg Hx    Rectal cancer Neg Hx    Stomach cancer Neg Hx     Social History Social History   Tobacco Use   Smoking status: Never   Smokeless tobacco: Never  Vaping Use   Vaping status: Never Used  Substance Use Topics   Alcohol use: Yes    Alcohol/week: 7.0 standard drinks of alcohol    Types: 7 Glasses of wine per week   Drug use: No     Allergies   Latex and Tape   Review of Systems Review of Systems  Constitutional:  Positive for appetite change and fatigue. Negative for chills and fever.  HENT:  Positive for congestion. Negative for ear pain and sore throat.   Eyes:  Negative for pain and visual disturbance.  Respiratory:  Positive for cough and chest tightness. Negative for shortness of breath.   Cardiovascular:  Negative for chest pain and palpitations.   Gastrointestinal:  Negative for abdominal pain and vomiting.  Genitourinary:  Negative for dysuria and hematuria.  Musculoskeletal:  Negative for arthralgias and back pain.  Skin:  Negative for color change and rash.  Neurological:  Negative for seizures and syncope.  All other systems reviewed and are negative.    Physical Exam Triage Vital Signs ED Triage Vitals  Encounter Vitals Group     BP 10/12/23 1837 (!) 149/78     Systolic BP Percentile --      Diastolic BP Percentile --      Pulse Rate 10/12/23 1837 (!) 57     Resp 10/12/23 1837 20     Temp 10/12/23 1837 97.8 F (36.6 C)     Temp Source 10/12/23 1837 Oral     SpO2 10/12/23 1837 97 %     Weight --      Height --      Head Circumference --      Peak Flow --      Pain Score 10/12/23 1834 0     Pain Loc --      Pain Education --      Exclude from Growth Chart --    No data found.  Updated Vital Signs BP (!) 156/75 (BP Location: Right Arm)   Pulse (!) 57   Temp 97.8 F (36.6 C) (Oral)   Resp 20   SpO2 97%   Visual Acuity Right Eye Distance:   Left Eye Distance:   Bilateral Distance:    Right Eye Near:   Left Eye Near:    Bilateral Near:     Physical Exam Vitals and nursing note reviewed.  Constitutional:      General: He is not in acute distress.    Appearance: He is well-developed.  HENT:     Head: Normocephalic and atraumatic.     Right Ear: Tympanic membrane normal.     Left Ear: Tympanic membrane normal.     Nose: Congestion present.     Mouth/Throat:     Mouth: Mucous membranes are moist.     Pharynx: No posterior oropharyngeal erythema.  Eyes:     Conjunctiva/sclera: Conjunctivae normal.  Cardiovascular:     Rate and Rhythm: Normal rate and regular rhythm.     Heart sounds: No murmur heard. Pulmonary:     Effort: Pulmonary effort is normal. No respiratory distress.     Breath sounds: Examination of the right-lower field reveals decreased breath sounds and rhonchi. Examination of the  left-lower field reveals decreased breath sounds and rhonchi. Decreased  breath sounds and rhonchi present. No wheezing.  Abdominal:     Palpations: Abdomen is soft.     Tenderness: There is no abdominal tenderness.  Musculoskeletal:        General: No swelling.     Cervical back: Neck supple.  Skin:    General: Skin is warm and dry.     Capillary Refill: Capillary refill takes less than 2 seconds.  Neurological:     Mental Status: He is alert.  Psychiatric:        Mood and Affect: Mood normal.     UC Treatments / Results  Labs (all labs ordered are listed, but only abnormal results are displayed) Labs Reviewed - No data to display  EKG   Radiology No results found.  Procedures Procedures (including critical care time)  Medications Ordered in UC Medications - No data to display  Initial Impression / Assessment and Plan / UC Course  I have reviewed the triage vital signs and the nursing notes.  Pertinent labs & imaging results that were available during my care of the patient were reviewed by me and considered in my medical decision making (see chart for details).     Acute cough - Plan: DG Chest 2 View, DG Chest 2 View  Lower respiratory infection   Chest x-ray done today. Final results are pending final review by the radiologist but on brief review there is concern for enlarged bronchus and possible infiltrate consistent with pneumonia. Flu and covid testing deferred as symptoms have been going on for 5 days. Given this and the duration of symptoms we will treat with the following:  Azithromycin 250mg  Take 2 tablets today and the 1 tablet daily for 4 more days. (Hold the colchicine while on the antibiotic) Benzonatate (tessalon) 100 mg every 8 hours as needed for cough.  Albuterol inhaler 1-2 puffs every 6 hours as needed for wheezing/shortness of breath.  Rest and stay hydrated.  Drink plenty of fluids even if you do not feel like taking in solid foods Return to  urgent care or PCP if symptoms worsen or fail to resolve.    Final Clinical Impressions(s) / UC Diagnoses   Final diagnoses:  None   Discharge Instructions   None    ED Prescriptions   None    PDMP not reviewed this encounter.   Landis Martins, [place] 10/12/23 1930

## 2023-10-23 DIAGNOSIS — H0288B Meibomian gland dysfunction left eye, upper and lower eyelids: Secondary | ICD-10-CM | POA: Diagnosis not present

## 2023-10-23 DIAGNOSIS — H40012 Open angle with borderline findings, low risk, left eye: Secondary | ICD-10-CM | POA: Diagnosis not present

## 2023-10-23 DIAGNOSIS — H0288A Meibomian gland dysfunction right eye, upper and lower eyelids: Secondary | ICD-10-CM | POA: Diagnosis not present

## 2023-10-23 DIAGNOSIS — H2513 Age-related nuclear cataract, bilateral: Secondary | ICD-10-CM | POA: Diagnosis not present

## 2024-01-15 DIAGNOSIS — N1831 Chronic kidney disease, stage 3a: Secondary | ICD-10-CM | POA: Diagnosis not present

## 2024-01-15 DIAGNOSIS — R7301 Impaired fasting glucose: Secondary | ICD-10-CM | POA: Diagnosis not present

## 2024-01-15 DIAGNOSIS — E559 Vitamin D deficiency, unspecified: Secondary | ICD-10-CM | POA: Diagnosis not present

## 2024-01-15 DIAGNOSIS — Z125 Encounter for screening for malignant neoplasm of prostate: Secondary | ICD-10-CM | POA: Diagnosis not present

## 2024-01-15 DIAGNOSIS — M109 Gout, unspecified: Secondary | ICD-10-CM | POA: Diagnosis not present

## 2024-01-15 DIAGNOSIS — E785 Hyperlipidemia, unspecified: Secondary | ICD-10-CM | POA: Diagnosis not present

## 2024-01-23 DIAGNOSIS — E785 Hyperlipidemia, unspecified: Secondary | ICD-10-CM | POA: Diagnosis not present

## 2024-01-23 DIAGNOSIS — I2510 Atherosclerotic heart disease of native coronary artery without angina pectoris: Secondary | ICD-10-CM | POA: Diagnosis not present

## 2024-01-23 DIAGNOSIS — L089 Local infection of the skin and subcutaneous tissue, unspecified: Secondary | ICD-10-CM | POA: Diagnosis not present

## 2024-01-23 DIAGNOSIS — Z1331 Encounter for screening for depression: Secondary | ICD-10-CM | POA: Diagnosis not present

## 2024-01-23 DIAGNOSIS — M109 Gout, unspecified: Secondary | ICD-10-CM | POA: Diagnosis not present

## 2024-01-23 DIAGNOSIS — M169 Osteoarthritis of hip, unspecified: Secondary | ICD-10-CM | POA: Diagnosis not present

## 2024-01-23 DIAGNOSIS — I700 Atherosclerosis of aorta: Secondary | ICD-10-CM | POA: Diagnosis not present

## 2024-01-23 DIAGNOSIS — Z0000 Encounter for general adult medical examination without abnormal findings: Secondary | ICD-10-CM | POA: Diagnosis not present

## 2024-01-23 DIAGNOSIS — K644 Residual hemorrhoidal skin tags: Secondary | ICD-10-CM | POA: Diagnosis not present

## 2024-01-23 DIAGNOSIS — M8580 Other specified disorders of bone density and structure, unspecified site: Secondary | ICD-10-CM | POA: Diagnosis not present

## 2024-01-23 DIAGNOSIS — E559 Vitamin D deficiency, unspecified: Secondary | ICD-10-CM | POA: Diagnosis not present

## 2024-01-23 DIAGNOSIS — K219 Gastro-esophageal reflux disease without esophagitis: Secondary | ICD-10-CM | POA: Diagnosis not present

## 2024-01-23 DIAGNOSIS — R82998 Other abnormal findings in urine: Secondary | ICD-10-CM | POA: Diagnosis not present

## 2024-01-25 DIAGNOSIS — L814 Other melanin hyperpigmentation: Secondary | ICD-10-CM | POA: Diagnosis not present

## 2024-01-25 DIAGNOSIS — L821 Other seborrheic keratosis: Secondary | ICD-10-CM | POA: Diagnosis not present

## 2024-01-25 DIAGNOSIS — L578 Other skin changes due to chronic exposure to nonionizing radiation: Secondary | ICD-10-CM | POA: Diagnosis not present

## 2024-01-25 DIAGNOSIS — Z808 Family history of malignant neoplasm of other organs or systems: Secondary | ICD-10-CM | POA: Diagnosis not present

## 2024-01-25 DIAGNOSIS — Z86018 Personal history of other benign neoplasm: Secondary | ICD-10-CM | POA: Diagnosis not present

## 2024-01-25 DIAGNOSIS — D2271 Melanocytic nevi of right lower limb, including hip: Secondary | ICD-10-CM | POA: Diagnosis not present

## 2024-01-25 DIAGNOSIS — D225 Melanocytic nevi of trunk: Secondary | ICD-10-CM | POA: Diagnosis not present

## 2024-03-15 DIAGNOSIS — Z23 Encounter for immunization: Secondary | ICD-10-CM | POA: Diagnosis not present

## 2024-04-01 DIAGNOSIS — M8589 Other specified disorders of bone density and structure, multiple sites: Secondary | ICD-10-CM | POA: Diagnosis not present
